# Patient Record
Sex: Male | Born: 1950 | Smoking: Former smoker
Health system: Southern US, Community
[De-identification: ages and names within clinical notes are randomized; demographics above are authoritative.]

## PROBLEM LIST (undated history)

## (undated) DIAGNOSIS — R7303 Prediabetes: Secondary | ICD-10-CM

## (undated) DIAGNOSIS — E785 Hyperlipidemia, unspecified: Secondary | ICD-10-CM

## (undated) DIAGNOSIS — I10 Essential (primary) hypertension: Secondary | ICD-10-CM

## (undated) HISTORY — DX: Prediabetes: R73.03

## (undated) HISTORY — DX: Hyperlipidemia, unspecified: E78.5

## (undated) HISTORY — DX: Essential (primary) hypertension: I10

---

## 2014-04-06 HISTORY — PX: COLONOSCOPY: SHX174

## 2016-01-06 ENCOUNTER — Encounter: Payer: Self-pay | Admitting: Dietician

## 2016-01-06 ENCOUNTER — Encounter: Payer: Medicare Other | Attending: Family Medicine | Admitting: Dietician

## 2016-01-06 DIAGNOSIS — E7800 Pure hypercholesterolemia, unspecified: Secondary | ICD-10-CM

## 2016-01-06 DIAGNOSIS — E669 Obesity, unspecified: Secondary | ICD-10-CM | POA: Insufficient documentation

## 2016-01-06 DIAGNOSIS — R7303 Prediabetes: Secondary | ICD-10-CM

## 2016-01-06 DIAGNOSIS — E785 Hyperlipidemia, unspecified: Secondary | ICD-10-CM | POA: Diagnosis not present

## 2016-01-06 DIAGNOSIS — Z713 Dietary counseling and surveillance: Secondary | ICD-10-CM | POA: Diagnosis not present

## 2016-01-06 DIAGNOSIS — Z6837 Body mass index (BMI) 37.0-37.9, adult: Secondary | ICD-10-CM | POA: Insufficient documentation

## 2016-01-06 NOTE — Patient Instructions (Addendum)
Go to the gym 3 times per week. On the other days walk, or other exercise for 30 minutes or more.   Slow down when you eat and stop when you are satisfied. Drink beverages with zero grams of carbohydrates. Choose low fat milk Choose lean meat, trim meat, take skin from chicken Avoid fried foods Half your plate should be non starchy vegetables

## 2016-01-06 NOTE — Progress Notes (Addendum)
  Medical Nutrition Therapy:  Appt start time: 0830 end time:  0945. Patient arrived late.   Assessment:  Primary concerns today: Patient is here alone.  He would like to lose weight and learn more about prediabetes and how to improve this.  Referral also for hyperlipidemia.  A1C is unknown.    Weight hx: Weight today is 196 lbs.  Highest adult weight. 185 lbs 3-4 months ago.  120 lbs at 65 yo lowest weight 175 UBW about 10 years ago.   Patient lives with his sister.  Both shop and cook.  He [place] from [facility] [place] ago and is retired from [employer].   Preferred Learning Style:   No preference indicated   Learning Readiness:   Ready  MEDICATIONS: see list   DIETARY INTAKE: Eats Usual eating pattern includes 2-3 meals and 2 snacks per day.  Avoided foods include added sugar, icing, added salt.    24-hr recall:  B (8 AM):  Skips or once a week 3 eggs, 6 strips bacon, grits, 2 slices toast with margarine or Peanut Butter AND always Coffee with creamer Snk ( AM): none L ( PM): Ham and cheese Sandwich on [employer] with mayo Snk ( PM): cookies or apple pie or fruit or veges more recently D (8 PM):  corn, macaroni salad, spare ribs Snk ( PM): grapes, banana or "junk food" Beverages: beer (cut down from 64 ounces beer daily to 16-32 ounces 2 times per week), water, diluted sweet tea, juice, hot lemon or vinegar and honey water.  Usual physical activity: treadmill for 30 minutes, swimming for 30 minutes weekly, a few weights  Estimated energy needs: 1400 calories 158 g carbohydrates 105 g protein 39 g fat  Progress Towards Goal(s):  In progress.   Nutritional Diagnosis:  NB-1.1 Food and nutrition-related knowledge deficit As related to balance of carbohydrate, protein, and fat.  As evidenced by patient report and diet hx.    Intervention:  Nutrition counseling and diabetes education initiated. Discussed Carb Counting by food group as method of portion control, reading food  labels, and benefits of increased activity. Also discussed basic physiology of Diabetes and A1c.  Discussed preferred fats, decreased portion size of meat and decreasing fat and refined carbohydrates.  Go to the gym 3 times per week. On the other days walk, or other exercise for 30 minutes or more.   Slow down when you eat and stop when you are satisfied. Drink beverages with zero grams of carbohydrates. Choose low fat milk Choose lean meat, trim meat, take skin from chicken Avoid fried foods Half your plate should be non starchy vegetables  Teaching Method Utilized:  Visual Auditory Hands on  Handouts given during visit include: Living Well with Diabetes Food Label handouts Meal Plan Card  Snack list  Label reaidng  Barriers to learning/adherence to lifestyle change: slow to understand  Demonstrated degree of understanding via:  Teach Back   Monitoring/Evaluation:  Dietary intake, exercise, label reading, and body weight in 1 month(s).

## 2016-02-10 ENCOUNTER — Ambulatory Visit: Payer: Medicare Other | Admitting: Dietician

## 2017-11-08 ENCOUNTER — Other Ambulatory Visit: Payer: Self-pay | Admitting: Family Medicine

## 2017-11-08 DIAGNOSIS — Z87891 Personal history of nicotine dependence: Secondary | ICD-10-CM

## 2017-11-17 ENCOUNTER — Ambulatory Visit: Payer: Medicare Other

## 2017-11-19 ENCOUNTER — Ambulatory Visit
Admission: RE | Admit: 2017-11-19 | Discharge: 2017-11-19 | Disposition: A | Discharge status: Home / Self Care | Payer: Medicare Other | Source: Ambulatory Visit | Attending: Family Medicine | Admitting: Family Medicine

## 2017-11-19 DIAGNOSIS — Z87891 Personal history of nicotine dependence: Secondary | ICD-10-CM

## 2018-01-11 ENCOUNTER — Other Ambulatory Visit (HOSPITAL_COMMUNITY): Payer: Self-pay | Admitting: Gastroenterology

## 2018-01-11 DIAGNOSIS — B1920 Unspecified viral hepatitis C without hepatic coma: Secondary | ICD-10-CM

## 2018-01-25 ENCOUNTER — Ambulatory Visit (HOSPITAL_COMMUNITY)
Admission: RE | Admit: 2018-01-25 | Discharge: 2018-01-25 | Disposition: A | Discharge status: Home / Self Care | Payer: Medicare Other | Source: Ambulatory Visit | Attending: Gastroenterology | Admitting: Gastroenterology

## 2018-01-25 DIAGNOSIS — B1920 Unspecified viral hepatitis C without hepatic coma: Secondary | ICD-10-CM | POA: Diagnosis not present

## 2018-07-12 ENCOUNTER — Other Ambulatory Visit: Payer: Self-pay | Admitting: Gastroenterology

## 2018-07-12 DIAGNOSIS — K7400 Hepatic fibrosis, unspecified: Secondary | ICD-10-CM

## 2019-01-03 IMAGING — US US ABDOMINAL AORTA SCREENING AAA
1 series · 14 of 16 positions shown · non-contrast
Comparison: None.

CLINICAL DATA: Male between 65-75 years of age with a smoking
history.

EXAM:
US ABDOMINAL AORTA MEDICARE SCREENING
TECHNIQUE: Ultrasound examination of the abdominal aorta was performed as a
screening evaluation for abdominal aortic aneurysm.

[Series 1: us abdominal aorta screening aaa · 0.28mm/px · 14 of 16 slices shown]
[im 1/16]
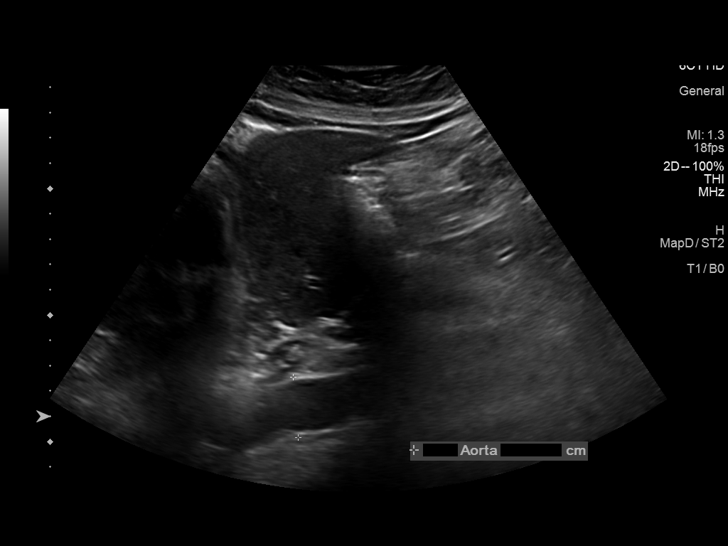
[im 2/16]
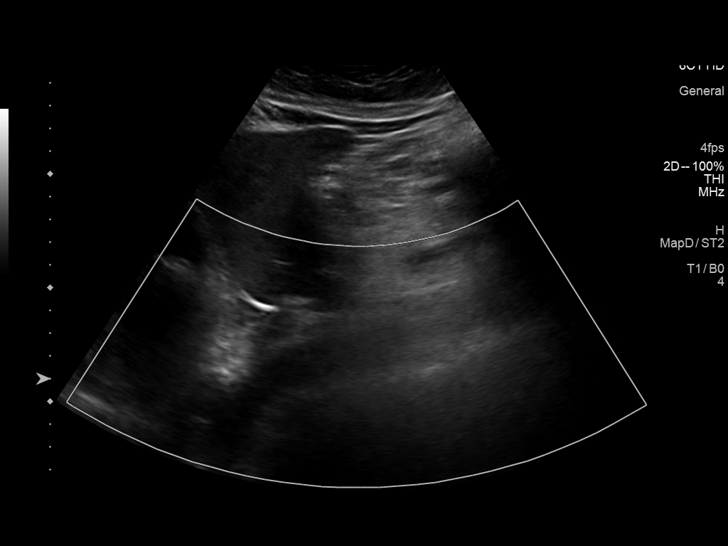
[im 3/16]
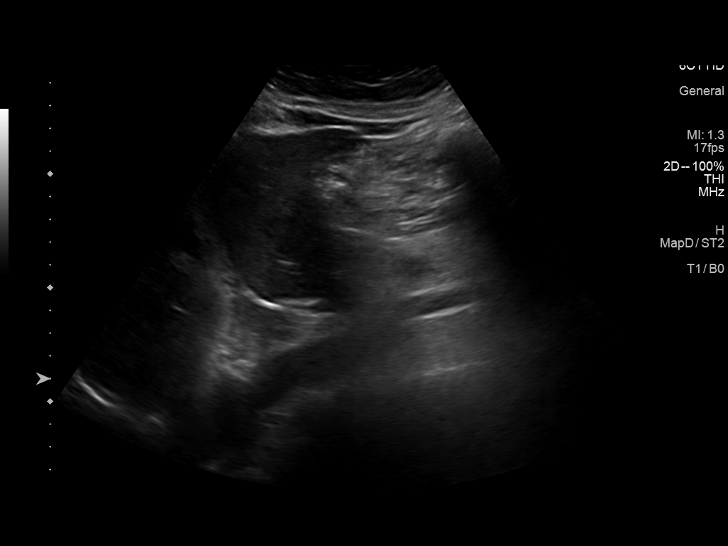
[im 5/16]
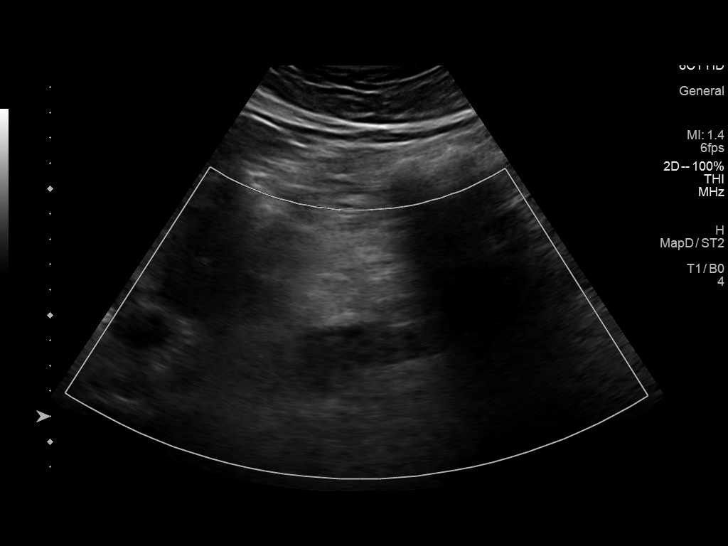
[im 6/16]
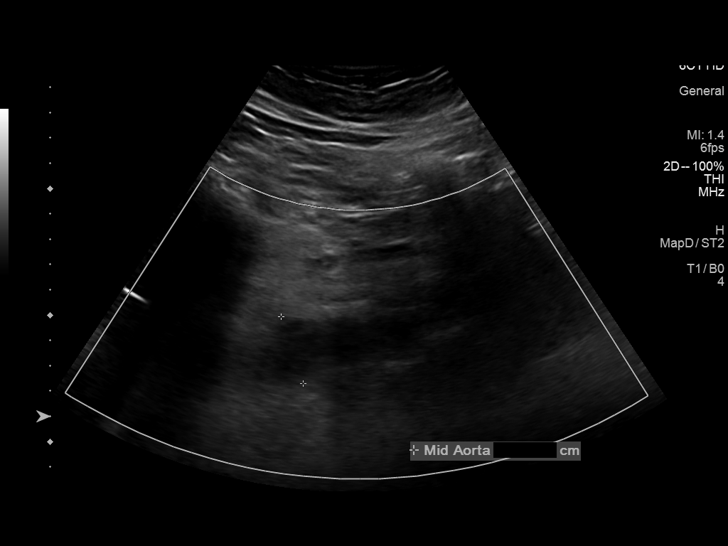
[im 7/16]
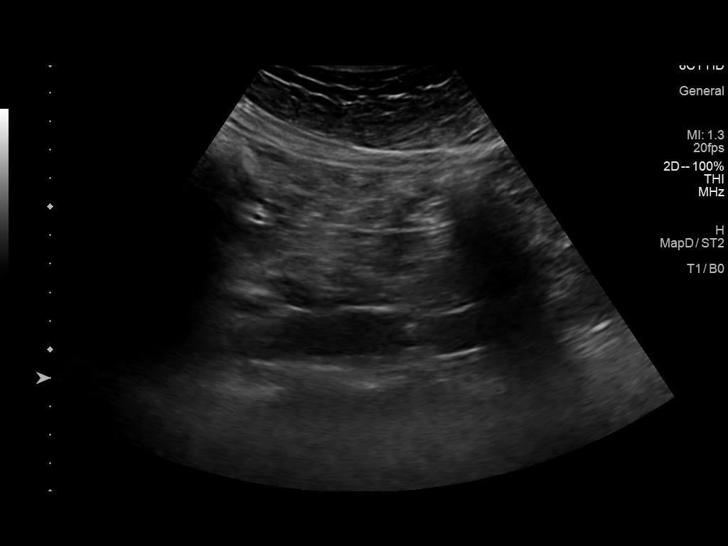
[im 8/16]
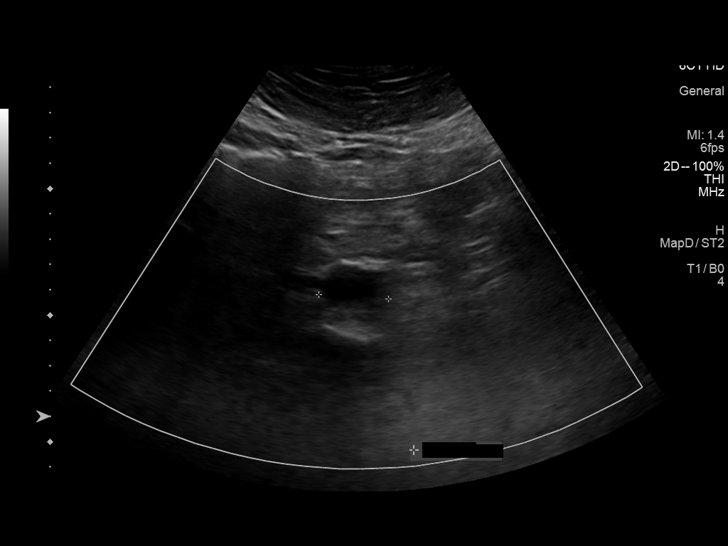
[im 9/16]
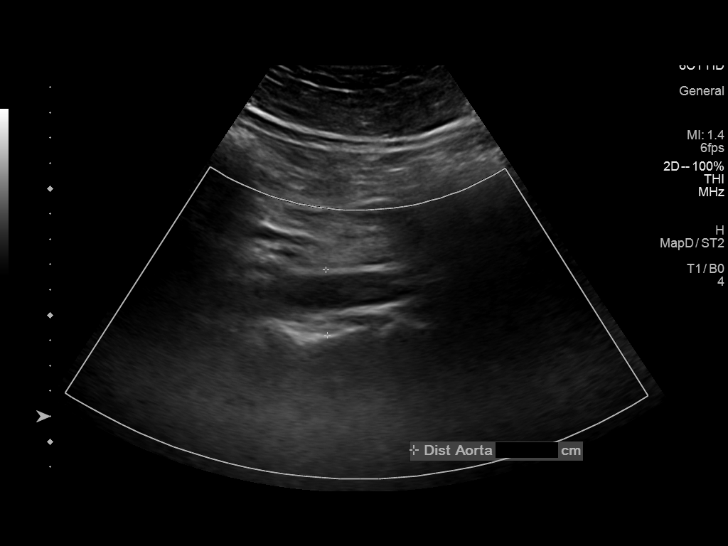
[im 10/16]
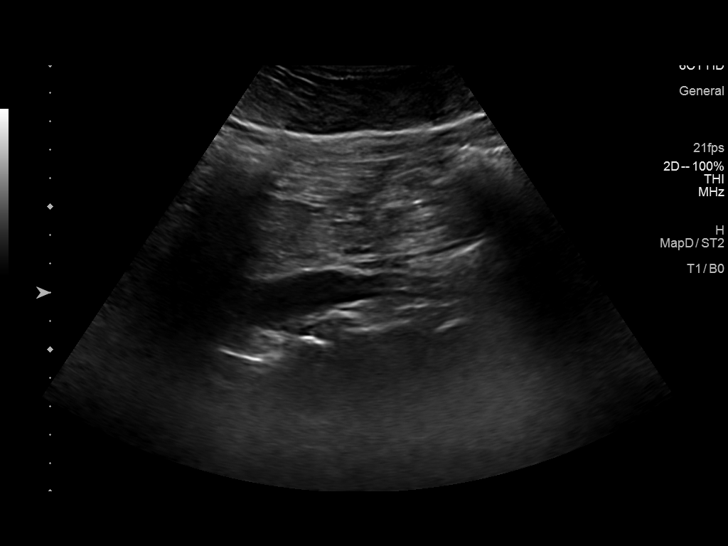
[im 11/16]
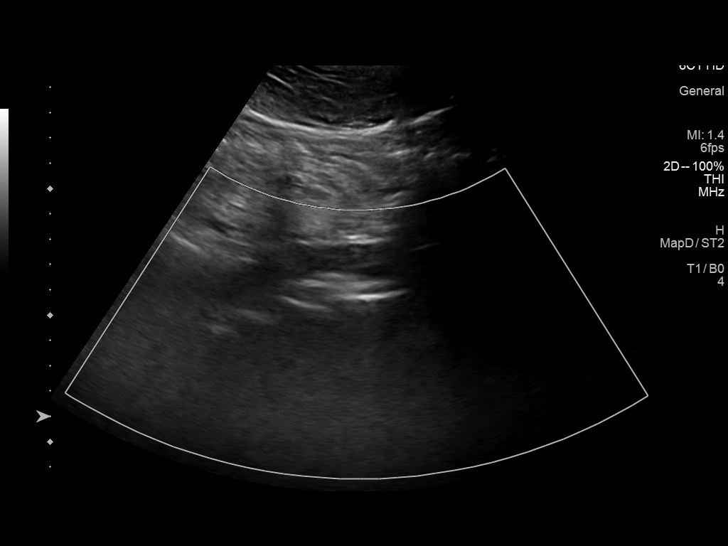
[im 13/16]
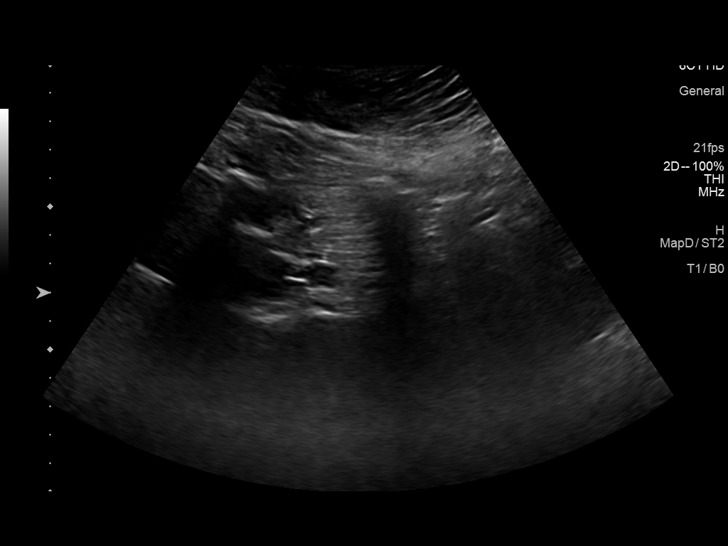
[im 14/16]
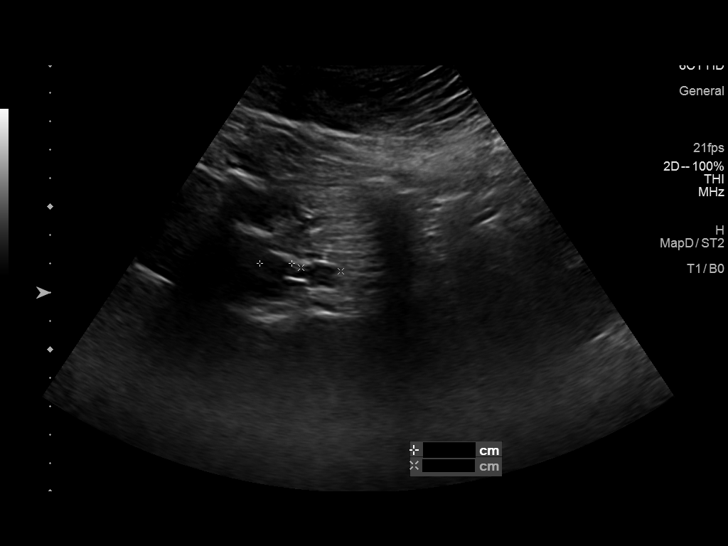
[im 15/16]
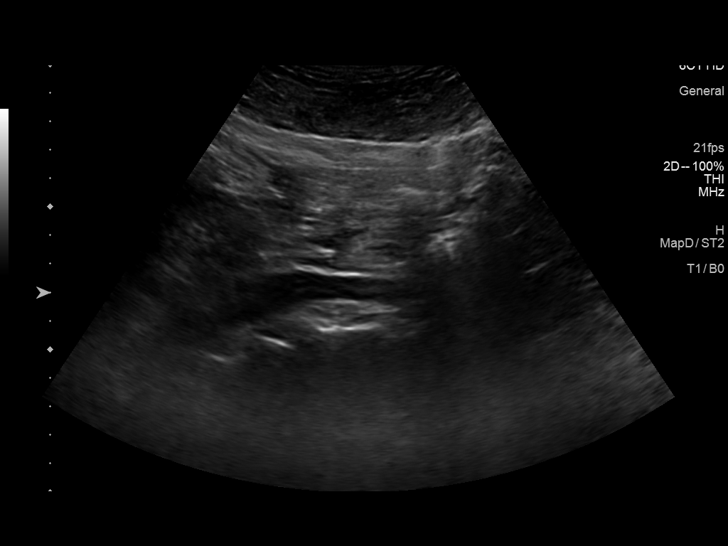
[im 16/16]
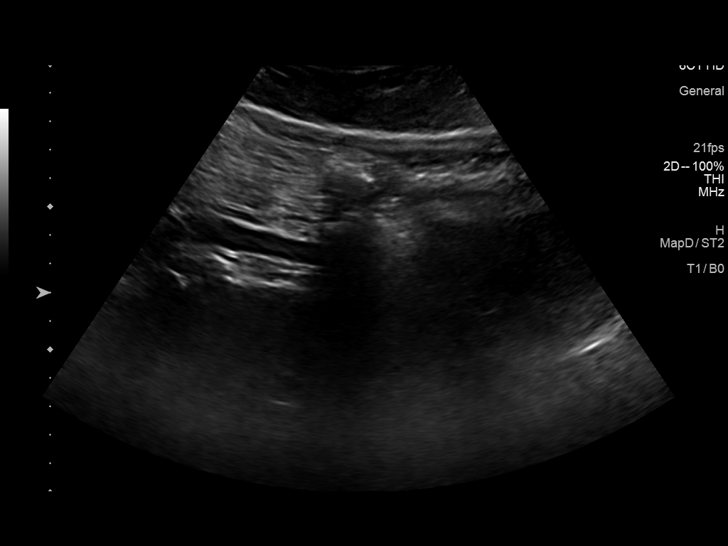

[14 of 16 positions shown; findings below may reference images not displayed]

FINDINGS: Abdominal aortic measurements as follows:

Proximal:  2.5 x 2.4 cm

Mid:  2.8 x 2.8 cm

Distal:  2.6 x 2.0 cm

Mixed echogenic plaque seen within the mid and distal aspects of the
abdominal aorta.
IMPRESSION: 1. Mild ectasia of the abdominal aorta measuring 2.8 cm in maximal
diameter. Recommend follow-up aortic ultrasound in 5 years. This
recommendation follows ACR consensus guidelines: White Paper of the
ACR Incidental Findings Committee II on Vascular Findings. [HOSPITAL] 6173; [DATE].
2.  Aortic Atherosclerosis ([id number]-HUX.X).

## 2019-08-16 ENCOUNTER — Ambulatory Visit: Payer: Medicare Other | Attending: Internal Medicine

## 2019-10-28 IMAGING — US US ABDOMEN LIMITED W/ ELASTOGRAPHY
1 series · 13 of 25 positions shown · non-contrast
Comparison: None.

CLINICAL DATA: Chronic hepatitis-C without hepatic coma.

EXAM:
US ABDOMEN LIMITED - RIGHT UPPER QUADRANT
ULTRASOUND HEPATIC ELASTOGRAPHY
TECHNIQUE: Limited right upper quadrant abdominal ultrasound was performed. In
addition, ultrasound elastography evaluation of the liver was
performed. A region of interest was placed in the right lobe of the
liver. Following application of a compressive sonographic pulse,
shear waves were detected in the adjacent hepatic tissue and the
shear wave velocity was calculated. Multiple assessments were
performed at the selected site. Median shear wave velocity is
correlated to a Metavir fibrosis score.

[Series 1: us abdomen limited w/ elastography · 0.24mm/px · 13 of 65 slices shown]
[im 1/65]
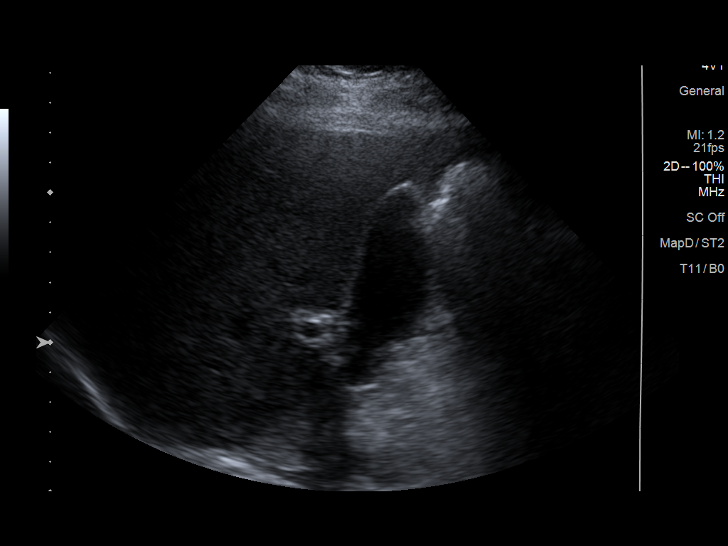
[im 6/65]
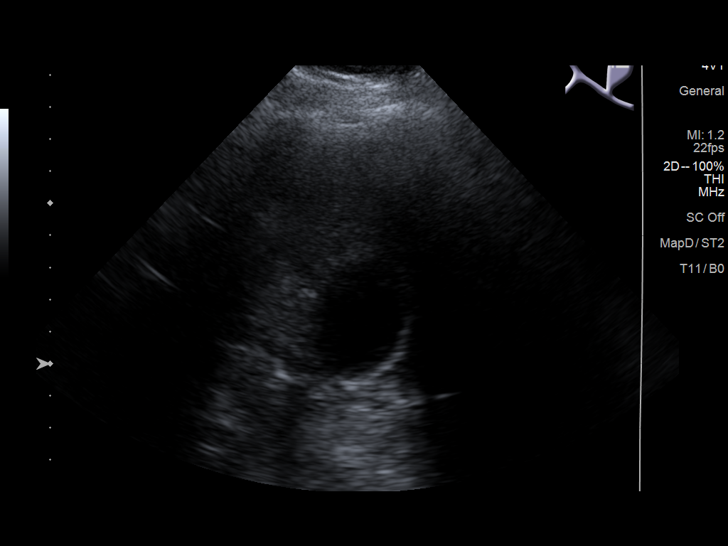
[im 11/65]
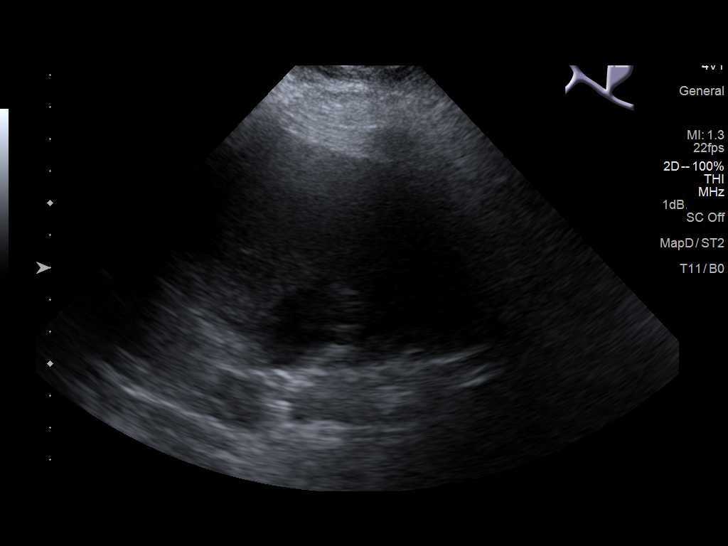
[im 17/65]
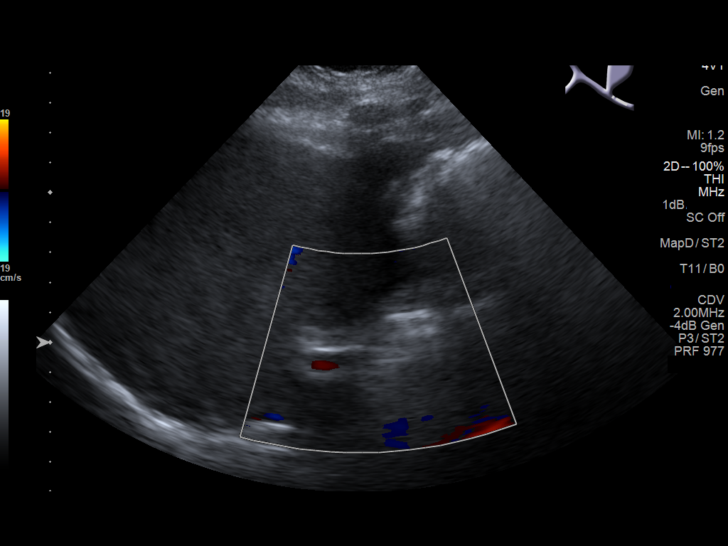
[im 22/65]
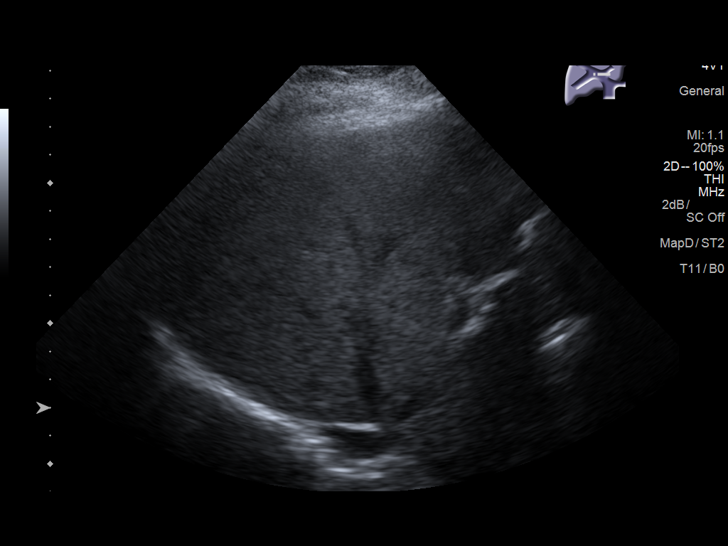
[im 27/65]
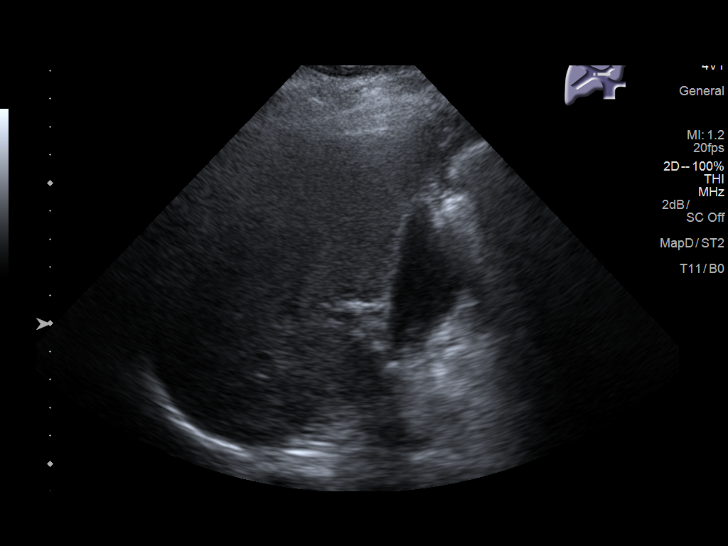
[im 33/65]
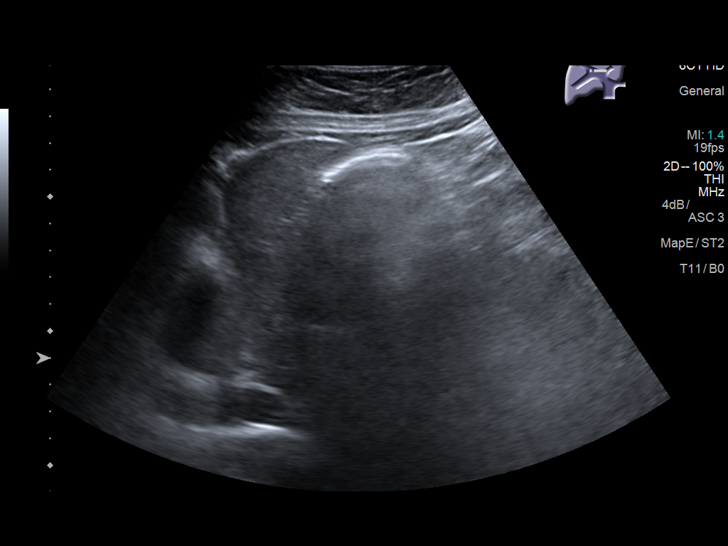
[im 38/65]
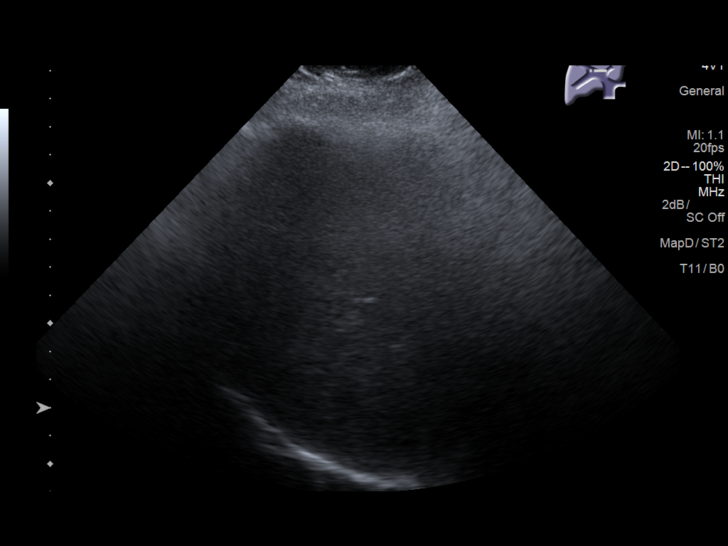
[im 43/65]
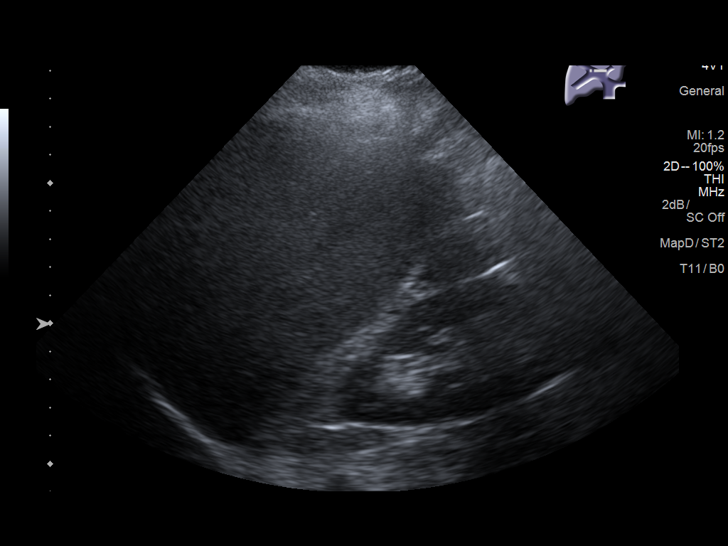
[im 49/65]
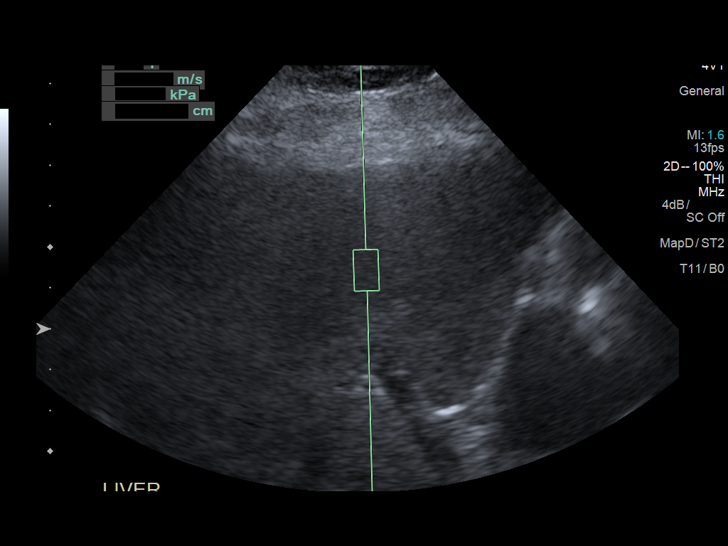
[im 54/65]
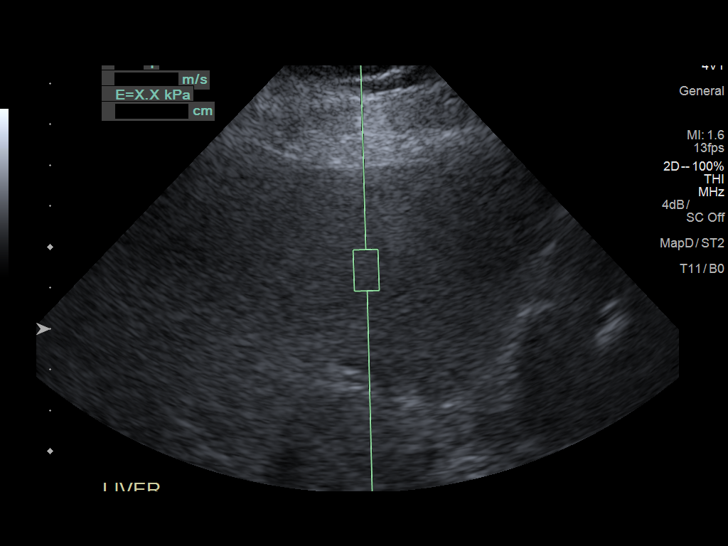
[im 59/65]
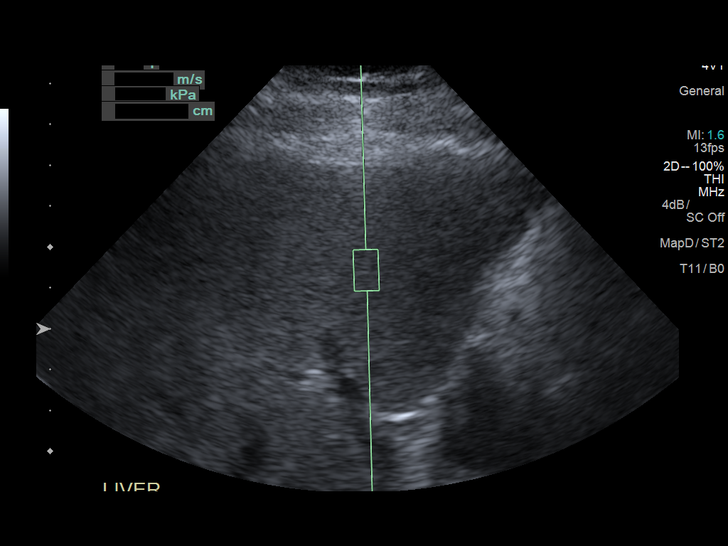
[im 65/65]
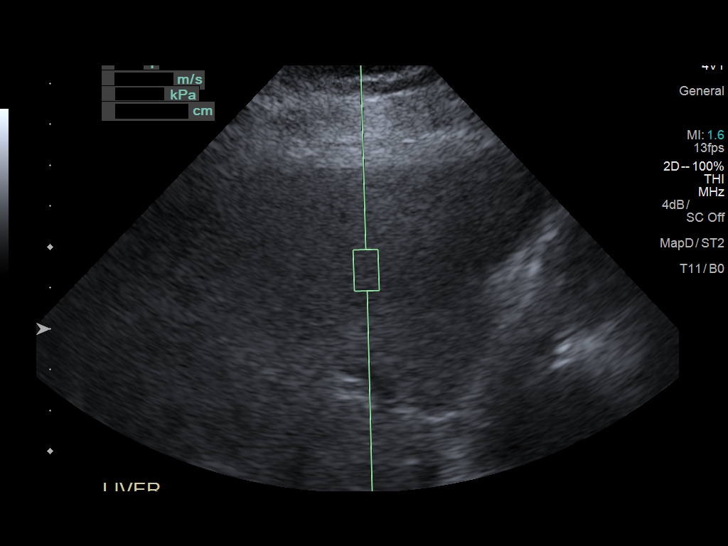

[13 of 25 positions shown; findings below may reference images not displayed]

FINDINGS: ULTRASOUND ABDOMEN LIMITED RIGHT UPPER QUADRANT

Gallbladder:

No gallstones or wall thickening visualized. No sonographic Murphy
sign noted.

Common bile duct:

Diameter: 4 mm, within normal limits.

Liver:

No focal lesion identified. Within normal limits in parenchymal
echogenicity. Portal vein is patent on color Doppler imaging with
normal direction of blood flow towards the liver.

ULTRASOUND HEPATIC ELASTOGRAPHY

Device: Siemens Helix VTQ

Patient position: Left Lateral Decubitus

Transducer 4V1

Number of measurements: 9

Hepatic segment:  8

Median velocity:   4.12 m/sec

IQR:

IQR/Median velocity ratio:

Corresponding Metavir fibrosis score:  Some F3 + F4

Risk of fibrosis: High

Limitations of exam: Patient unable to perform breath holding

Please note that abnormal shear wave velocities may also be
identified in clinical settings other than with hepatic fibrosis,
such as: acute hepatitis, elevated right heart and central venous
pressures including use of beta blockers, Anjo Gabriel disease
([place]), infiltrative processes such as
mastocytosis/amyloidosis/infiltrative tumor, extrahepatic
cholestasis, in the post-prandial state, and liver transplantation.
Correlation with patient history, laboratory data, and clinical
condition recommended.
IMPRESSION: ULTRASOUND ABDOMEN:

Negative.  No hepatobiliary abnormality identified.

ULTRASOUND HEPATIC ELASTOGRAHY:

Median hepatic shear wave velocity is calculated at 4.12 m/sec.

Corresponding Metavir fibrosis score is Some F3 + F4.

Risk of fibrosis is High.

Follow-up: Follow up advised

## 2020-05-14 DIAGNOSIS — J4520 Mild intermittent asthma, uncomplicated: Secondary | ICD-10-CM | POA: Diagnosis not present

## 2020-05-14 DIAGNOSIS — E7800 Pure hypercholesterolemia, unspecified: Secondary | ICD-10-CM | POA: Diagnosis not present

## 2020-05-14 DIAGNOSIS — K219 Gastro-esophageal reflux disease without esophagitis: Secondary | ICD-10-CM | POA: Diagnosis not present

## 2020-05-14 DIAGNOSIS — I10 Essential (primary) hypertension: Secondary | ICD-10-CM | POA: Diagnosis not present

## 2020-05-14 DIAGNOSIS — H269 Unspecified cataract: Secondary | ICD-10-CM | POA: Diagnosis not present

## 2020-05-14 DIAGNOSIS — J45909 Unspecified asthma, uncomplicated: Secondary | ICD-10-CM | POA: Diagnosis not present

## 2020-05-30 DIAGNOSIS — R0981 Nasal congestion: Secondary | ICD-10-CM | POA: Diagnosis not present

## 2020-06-24 DIAGNOSIS — J310 Chronic rhinitis: Secondary | ICD-10-CM | POA: Diagnosis not present

## 2020-06-24 DIAGNOSIS — J342 Deviated nasal septum: Secondary | ICD-10-CM | POA: Diagnosis not present

## 2020-06-24 DIAGNOSIS — J343 Hypertrophy of nasal turbinates: Secondary | ICD-10-CM | POA: Diagnosis not present

## 2020-06-28 ENCOUNTER — Other Ambulatory Visit: Payer: Self-pay | Admitting: Otolaryngology

## 2020-07-01 ENCOUNTER — Other Ambulatory Visit: Payer: Self-pay

## 2020-07-01 ENCOUNTER — Encounter (HOSPITAL_BASED_OUTPATIENT_CLINIC_OR_DEPARTMENT_OTHER): Payer: Self-pay | Admitting: Otolaryngology

## 2020-07-03 ENCOUNTER — Encounter (HOSPITAL_BASED_OUTPATIENT_CLINIC_OR_DEPARTMENT_OTHER)
Admission: RE | Admit: 2020-07-03 | Discharge: 2020-07-03 | Disposition: A | Discharge status: Home / Self Care | Payer: Medicare Other | Source: Ambulatory Visit | Attending: Otolaryngology | Admitting: Otolaryngology

## 2020-07-03 DIAGNOSIS — Z01812 Encounter for preprocedural laboratory examination: Secondary | ICD-10-CM | POA: Insufficient documentation

## 2020-07-03 DIAGNOSIS — Z20822 Contact with and (suspected) exposure to covid-19: Secondary | ICD-10-CM | POA: Diagnosis not present

## 2020-07-03 LAB — BASIC METABOLIC PANEL
Anion gap: 5 (ref 5–15)
BUN: 10 mg/dL (ref 8–23)
CO2: 23 mmol/L (ref 22–32)
Calcium: 9.3 mg/dL (ref 8.9–10.3)
Chloride: 111 mmol/L (ref 98–111)
Creatinine, Ser: 1.04 mg/dL (ref 0.61–1.24)
GFR, Estimated: >60 mL/min (ref >60)
Glucose, Bld: 103 mg/dL — ABNORMAL HIGH (ref 70–99)
Potassium: 4.7 mmol/L (ref 3.5–5.1)
Sodium: 139 mmol/L (ref 135–145)

## 2020-07-05 ENCOUNTER — Other Ambulatory Visit (HOSPITAL_COMMUNITY)
Admission: RE | Admit: 2020-07-05 | Discharge: 2020-07-05 | Disposition: A | Discharge status: Home / Self Care | Payer: Medicare Other | Source: Ambulatory Visit | Attending: Otolaryngology | Admitting: Otolaryngology

## 2020-07-05 DIAGNOSIS — Z20822 Contact with and (suspected) exposure to covid-19: Secondary | ICD-10-CM | POA: Diagnosis not present

## 2020-07-05 DIAGNOSIS — Z01812 Encounter for preprocedural laboratory examination: Secondary | ICD-10-CM | POA: Diagnosis not present

## 2020-07-05 LAB — SARS CORONAVIRUS 2 (TAT 6-24 HRS): SARS Coronavirus 2: NEGATIVE

## 2020-07-08 DIAGNOSIS — I10 Essential (primary) hypertension: Secondary | ICD-10-CM | POA: Diagnosis not present

## 2020-07-08 DIAGNOSIS — J4520 Mild intermittent asthma, uncomplicated: Secondary | ICD-10-CM | POA: Diagnosis not present

## 2020-07-08 DIAGNOSIS — K219 Gastro-esophageal reflux disease without esophagitis: Secondary | ICD-10-CM | POA: Diagnosis not present

## 2020-07-08 DIAGNOSIS — E7800 Pure hypercholesterolemia, unspecified: Secondary | ICD-10-CM | POA: Diagnosis not present

## 2020-07-08 DIAGNOSIS — J45909 Unspecified asthma, uncomplicated: Secondary | ICD-10-CM | POA: Diagnosis not present

## 2020-07-08 DIAGNOSIS — H269 Unspecified cataract: Secondary | ICD-10-CM | POA: Diagnosis not present

## 2020-07-09 ENCOUNTER — Encounter (HOSPITAL_BASED_OUTPATIENT_CLINIC_OR_DEPARTMENT_OTHER)
Admission: RE | Disposition: A | Discharge status: Home / Self Care | Payer: Self-pay | Source: Home / Self Care | Attending: Otolaryngology

## 2020-07-09 ENCOUNTER — Ambulatory Visit (HOSPITAL_BASED_OUTPATIENT_CLINIC_OR_DEPARTMENT_OTHER): Payer: Medicare Other | Admitting: Anesthesiology

## 2020-07-09 ENCOUNTER — Other Ambulatory Visit: Payer: Self-pay

## 2020-07-09 ENCOUNTER — Ambulatory Visit (HOSPITAL_BASED_OUTPATIENT_CLINIC_OR_DEPARTMENT_OTHER)
Admission: RE | Admit: 2020-07-09 | Discharge: 2020-07-09 | Disposition: A | Discharge status: Home / Self Care | Payer: Medicare Other | Attending: Otolaryngology | Admitting: Otolaryngology

## 2020-07-09 ENCOUNTER — Encounter (HOSPITAL_BASED_OUTPATIENT_CLINIC_OR_DEPARTMENT_OTHER): Payer: Self-pay | Admitting: Otolaryngology

## 2020-07-09 DIAGNOSIS — R7303 Prediabetes: Secondary | ICD-10-CM | POA: Insufficient documentation

## 2020-07-09 DIAGNOSIS — J342 Deviated nasal septum: Secondary | ICD-10-CM | POA: Diagnosis not present

## 2020-07-09 DIAGNOSIS — Z87891 Personal history of nicotine dependence: Secondary | ICD-10-CM | POA: Insufficient documentation

## 2020-07-09 DIAGNOSIS — J343 Hypertrophy of nasal turbinates: Secondary | ICD-10-CM | POA: Diagnosis not present

## 2020-07-09 DIAGNOSIS — J3489 Other specified disorders of nose and nasal sinuses: Secondary | ICD-10-CM | POA: Diagnosis not present

## 2020-07-09 DIAGNOSIS — I10 Essential (primary) hypertension: Secondary | ICD-10-CM | POA: Insufficient documentation

## 2020-07-09 DIAGNOSIS — K219 Gastro-esophageal reflux disease without esophagitis: Secondary | ICD-10-CM | POA: Insufficient documentation

## 2020-07-09 HISTORY — PX: NASAL SEPTOPLASTY W/ TURBINOPLASTY: SHX2070

## 2020-07-09 LAB — GLUCOSE, CAPILLARY: Glucose-Capillary: 165 mg/dL — ABNORMAL HIGH (ref 70–99)

## 2020-07-09 SURGERY — SEPTOPLASTY, NOSE, WITH NASAL TURBINATE REDUCTION
Anesthesia: General | Site: Nose | Laterality: Bilateral

## 2020-07-09 MED ORDER — ROCURONIUM BROMIDE 10 MG/ML (PF) SYRINGE
PREFILLED_SYRINGE | INTRAVENOUS | Status: AC
  Filled 2020-07-09: qty 10

## 2020-07-09 MED ORDER — AMOXICILLIN 875 MG PO TABS: 875.0000 mg | ORAL_TABLET | Freq: Two times a day (BID) | ORAL | 0 refills | Status: AC

## 2020-07-09 MED ORDER — FENTANYL CITRATE (PF) 100 MCG/2ML IJ SOLN: 25.0000 ug | INTRAMUSCULAR | Status: DC | PRN

## 2020-07-09 MED ORDER — FENTANYL CITRATE (PF) 100 MCG/2ML IJ SOLN
INTRAMUSCULAR | Status: DC | PRN
  Administered 2020-07-09: 100 ug via INTRAVENOUS

## 2020-07-09 MED ORDER — PHENYLEPHRINE 40 MCG/ML (10ML) SYRINGE FOR IV PUSH (FOR BLOOD PRESSURE SUPPORT)
PREFILLED_SYRINGE | INTRAVENOUS | Status: AC
  Filled 2020-07-09: qty 10

## 2020-07-09 MED ORDER — CEFAZOLIN SODIUM 1 G IJ SOLR
INTRAMUSCULAR | Status: AC
  Filled 2020-07-09: qty 20

## 2020-07-09 MED ORDER — LIDOCAINE 2% (20 MG/ML) 5 ML SYRINGE
INTRAMUSCULAR | Status: AC
  Filled 2020-07-09: qty 5

## 2020-07-09 MED ORDER — MUPIROCIN 2 % EX OINT
TOPICAL_OINTMENT | CUTANEOUS | Status: DC | PRN
  Administered 2020-07-09: 1 via NASAL

## 2020-07-09 MED ORDER — MUPIROCIN 2 % EX OINT
TOPICAL_OINTMENT | CUTANEOUS | Status: AC
  Filled 2020-07-09: qty 22

## 2020-07-09 MED ORDER — EPHEDRINE 5 MG/ML INJ
INTRAVENOUS | Status: AC
  Filled 2020-07-09: qty 10

## 2020-07-09 MED ORDER — SUGAMMADEX SODIUM 200 MG/2ML IV SOLN
INTRAVENOUS | Status: DC | PRN
  Administered 2020-07-09: 170 mg via INTRAVENOUS

## 2020-07-09 MED ORDER — PROPOFOL 500 MG/50ML IV EMUL
INTRAVENOUS | Status: AC
  Filled 2020-07-09: qty 50

## 2020-07-09 MED ORDER — LACTATED RINGERS IV SOLN: INTRAVENOUS | Status: DC

## 2020-07-09 MED ORDER — DEXAMETHASONE SODIUM PHOSPHATE 4 MG/ML IJ SOLN
INTRAMUSCULAR | Status: DC | PRN
  Administered 2020-07-09: 10 mg via INTRAVENOUS

## 2020-07-09 MED ORDER — LIDOCAINE-EPINEPHRINE 1 %-1:100000 IJ SOLN
INTRAMUSCULAR | Status: AC
  Filled 2020-07-09: qty 1

## 2020-07-09 MED ORDER — OXYCODONE HCL 5 MG PO TABS
5.0000 mg | ORAL_TABLET | Freq: Once | ORAL | Status: DC | PRN
Start: 2020-07-09 — End: 2020-07-09

## 2020-07-09 MED ORDER — OXYCODONE HCL 5 MG/5ML PO SOLN
5.0000 mg | Freq: Once | ORAL | Status: DC | PRN
Start: 2020-07-09 — End: 2020-07-09

## 2020-07-09 MED ORDER — CEFAZOLIN SODIUM-DEXTROSE 2-3 GM-%(50ML) IV SOLR
INTRAVENOUS | Status: DC | PRN
  Administered 2020-07-09: 2 g via INTRAVENOUS

## 2020-07-09 MED ORDER — LABETALOL HCL 5 MG/ML IV SOLN
INTRAVENOUS | Status: DC | PRN
  Administered 2020-07-09 (×2): 10 mg via INTRAVENOUS

## 2020-07-09 MED ORDER — SUCCINYLCHOLINE CHLORIDE 200 MG/10ML IV SOSY
PREFILLED_SYRINGE | INTRAVENOUS | Status: AC
  Filled 2020-07-09: qty 10

## 2020-07-09 MED ORDER — ROCURONIUM BROMIDE 100 MG/10ML IV SOLN
INTRAVENOUS | Status: DC | PRN
  Administered 2020-07-09: 60 mg via INTRAVENOUS

## 2020-07-09 MED ORDER — DEXAMETHASONE SODIUM PHOSPHATE 10 MG/ML IJ SOLN
INTRAMUSCULAR | Status: AC
  Filled 2020-07-09: qty 1

## 2020-07-09 MED ORDER — FENTANYL CITRATE (PF) 100 MCG/2ML IJ SOLN
INTRAMUSCULAR | Status: AC
  Filled 2020-07-09: qty 2

## 2020-07-09 MED ORDER — OXYMETAZOLINE HCL 0.05 % NA SOLN
NASAL | Status: DC | PRN
  Administered 2020-07-09: 1 via TOPICAL

## 2020-07-09 MED ORDER — SUGAMMADEX SODIUM 500 MG/5ML IV SOLN
INTRAVENOUS | Status: AC
  Filled 2020-07-09: qty 5

## 2020-07-09 MED ORDER — OXYMETAZOLINE HCL 0.05 % NA SOLN
NASAL | Status: AC
  Filled 2020-07-09: qty 30

## 2020-07-09 MED ORDER — PROPOFOL 10 MG/ML IV BOLUS
INTRAVENOUS | Status: DC | PRN
  Administered 2020-07-09: 140 mg via INTRAVENOUS

## 2020-07-09 MED ORDER — LABETALOL HCL 5 MG/ML IV SOLN
INTRAVENOUS | Status: AC
  Filled 2020-07-09: qty 4

## 2020-07-09 MED ORDER — ONDANSETRON HCL 4 MG/2ML IJ SOLN
INTRAMUSCULAR | Status: AC
  Filled 2020-07-09: qty 2

## 2020-07-09 MED ORDER — ONDANSETRON HCL 4 MG/2ML IJ SOLN: 4.0000 mg | Freq: Once | INTRAMUSCULAR | Status: DC | PRN

## 2020-07-09 MED ORDER — LIDOCAINE-EPINEPHRINE 1 %-1:100000 IJ SOLN
INTRAMUSCULAR | Status: DC | PRN
  Administered 2020-07-09: 3 mL

## 2020-07-09 MED ORDER — OXYCODONE-ACETAMINOPHEN 5-325 MG PO TABS: 1.0000 | ORAL_TABLET | ORAL | 0 refills | Status: AC | PRN

## 2020-07-09 MED ORDER — LIDOCAINE HCL (CARDIAC) PF 100 MG/5ML IV SOSY
PREFILLED_SYRINGE | INTRAVENOUS | Status: DC | PRN
  Administered 2020-07-09: 80 mg via INTRAVENOUS

## 2020-07-09 SURGICAL SUPPLY — 32 items
ATTRACTOMAT 16X20 MAGNETIC DRP (DRAPES) IMPLANT
CANISTER SUCT 1200ML W/VALVE (MISCELLANEOUS) ×2 IMPLANT
COAGULATOR SUCT 8FR VV (MISCELLANEOUS) ×2 IMPLANT
COVER WAND RF STERILE (DRAPES) IMPLANT
DECANTER SPIKE VIAL GLASS SM (MISCELLANEOUS) ×2 IMPLANT
DRSG NASOPORE 8CM (GAUZE/BANDAGES/DRESSINGS) ×2 IMPLANT
DRSG TELFA 3X8 NADH (GAUZE/BANDAGES/DRESSINGS) IMPLANT
ELECT REM PT RETURN 9FT ADLT (ELECTROSURGICAL) ×2
ELECTRODE REM PT RTRN 9FT ADLT (ELECTROSURGICAL) ×1 IMPLANT
GLOVE SURG ENC MOIS LTX SZ7.5 (GLOVE) ×2 IMPLANT
GLOVE SURG POLYISO LF SZ7 (GLOVE) ×2 IMPLANT
GLOVE SURG UNDER POLY LF SZ7 (GLOVE) ×2 IMPLANT
GOWN STRL REUS W/ TWL LRG LVL3 (GOWN DISPOSABLE) ×2 IMPLANT
GOWN STRL REUS W/TWL LRG LVL3 (GOWN DISPOSABLE) ×4
NEEDLE HYPO 25X1 1.5 SAFETY (NEEDLE) ×2 IMPLANT
NS IRRIG 1000ML POUR BTL (IV SOLUTION) ×2 IMPLANT
PACK BASIN DAY SURGERY FS (CUSTOM PROCEDURE TRAY) ×2 IMPLANT
PACK ENT DAY SURGERY (CUSTOM PROCEDURE TRAY) ×2 IMPLANT
SLEEVE SCD COMPRESS KNEE MED (STOCKING) ×2 IMPLANT
SOLUTION BUTLER CLEAR DIP (MISCELLANEOUS) ×2 IMPLANT
SPLINT NASAL AIRWAY SILICONE (MISCELLANEOUS) ×2 IMPLANT
SPONGE GAUZE 2X2 8PLY STRL LF (GAUZE/BANDAGES/DRESSINGS) ×2 IMPLANT
SPONGE NEURO XRAY DETECT 1X3 (DISPOSABLE) ×2 IMPLANT
SUT CHROMIC 4 0 P 3 18 (SUTURE) ×2 IMPLANT
SUT PLAIN 4 0 ~~LOC~~ 1 (SUTURE) ×2 IMPLANT
SUT PROLENE 3 0 PS 2 (SUTURE) ×2 IMPLANT
SUT VIC AB 4-0 P-3 18XBRD (SUTURE) IMPLANT
SUT VIC AB 4-0 P3 18 (SUTURE)
TOWEL GREEN STERILE FF (TOWEL DISPOSABLE) ×2 IMPLANT
TUBE SALEM SUMP 12R W/ARV (TUBING) IMPLANT
TUBE SALEM SUMP 16 FR W/ARV (TUBING) ×2 IMPLANT
YANKAUER SUCT BULB TIP NO VENT (SUCTIONS) ×2 IMPLANT

## 2020-07-09 NOTE — H&P (Signed)
Cc: Chronic nasal obstruction  HPI: The patient is a 70 y/o male who presents today for evaluation of chronic nasal obstruction.  The patient has noted difficulty breathing through his nose for several years. His symptoms are worse at night. He notes some facial pressure but no pain or purulent drainage. The patient denies nasal trauma. He has tried steroid nasal sprays and other OTC sinus medications with no relief. Previous ENT surgery is denied.   The patient's review of systems (constitutional, eyes, ENT, cardiovascular, respiratory, GI, musculoskeletal, skin, neurologic, psychiatric, endocrine, hematologic, allergic) is noted in the ROS questionnaire.  It is reviewed with the patient.    Family health history: No HTN, DM, CAD, hearing loss or bleeding disorder.  Major events: None.  Ongoing medical problems: Hypertension, reflux, chronic bronchitis.  Social history: The patient is married. He is a former smoker. He denies the use of alcohol or illegal drugs.   Exam: General: Communicates without difficulty, well nourished, no acute distress. Head: Normocephalic, no evidence injury, no tenderness, facial buttresses intact without stepoff. Eyes: PERRL, EOMI. No scleral icterus, conjunctivae clear. Neuro: CN II exam reveals vision grossly intact.  No nystagmus at any point of gaze. Ears: Auricles well formed without lesions.  Ear canals are intact without mass or lesion.  No erythema or edema is appreciated.  The TMs are intact without fluid. Nose: External evaluation reveals normal support and skin without lesions.  Dorsum is intact.  Anterior rhinoscopy reveals severely congested and edematous mucosa over anterior aspect of the inferior turbinates and nasal septum.  No purulence is noted. Middle meatus is not well visualized. Oral:  Oral cavity and oropharynx are intact, symmetric, without erythema or edema.  Mucosa is moist without lesions. Neck: Full range of motion without pain.  There is no  significant lymphadenopathy.  No masses palpable.  Thyroid bed within normal limits to palpation.  Parotid glands and submandibular glands equal bilaterally without mass.  Trachea is midline. Neuro:  CN 2-12 grossly intact. Gait normal. Vestibular: No nystagmus at any point of gaze.   Procedure: Flexible Nasal Endoscopy Description: Risks, benefits, and alternatives of flexible endoscopy were explained to the patient. Specific mention was made of the risk of throat numbness with difficulty swallowing, possible bleeding from the nose and mouth, and pain from the procedure. The patient gave oral consent to proceed.  The flexible scope was inserted into the right nasal cavity. Endoscopy of the interior nasal cavity, superior, inferior, and middle meatus was performed. The sphenoid-ethmoid recess was examined. Edematous mucosa was noted. No polyp, mass, or lesion was appreciated. Olfactory cleft was clear. Nasopharynx was clear. Turbinates were hypertrophied but without mass. Incomplete response to decongestion. The procedure was repeated on the contralateral side with NSD. The patient tolerated the procedure well.   Assessment  1. Severe nasal mucosal congestion with bilateral inferior turbinate hypertrophy and left septal deviation. This results in >95% nasal obstruction.  2. No purulent drainage, polyps, or other suspicious mass or lesion is noted on today's nasal endoscopy.  Plan  1. The physical exam and nasal endoscopy findings reviewed with the patient.  2. Treatment options include conservative management with daily steroid nasal spray versus septoplasty and bilateral inferior turbinate reduction. The risks, benefits, alternatives, and details of the procedures are reviewed with the patient. Questions are invited and answered. 3. The patient is interested in proceeding with surgical intervention.  We will schedule the procedures in accordance with the patient's schedule.

## 2020-07-09 NOTE — Anesthesia Procedure Notes (Signed)
Procedure Name: Intubation Performed by: Ezequiel Kayser, CRNA Pre-anesthesia Checklist: Patient identified, Emergency Drugs available, Suction available and Patient being monitored Patient Re-evaluated:Patient Re-evaluated prior to induction Oxygen Delivery Method: Circle system utilized Preoxygenation: Pre-oxygenation with 100% oxygen Induction Type: IV induction Ventilation: Mask ventilation without difficulty Laryngoscope Size: Mac and 3 Grade View: Grade I Tube type: Oral Tube size: 7.5 mm Number of attempts: 1 Airway Equipment and Method: Stylet and Oral airway Placement Confirmation: ETT inserted through vocal cords under direct vision,  positive ETCO2 and breath sounds checked- equal and bilateral Secured at: 22 cm Tube secured with: Tape Dental Injury: Teeth and Oropharynx as per pre-operative assessment  Comments: Eyes taped prior to mask ventilation

## 2020-07-09 NOTE — Transfer of Care (Signed)
Immediate Anesthesia Transfer of Care Note  Patient: Shane Cabrera  Procedure(s) Performed: NASAL SEPTOPLASTY WITH BILATERAL  TURBINATE REDUCTION (Bilateral Nose)  Patient Location: PACU  Anesthesia Type:General  Level of Consciousness: awake, alert , oriented, drowsy and patient cooperative  Airway & Oxygen Therapy: Patient Spontanous Breathing and Patient connected to face mask oxygen  Post-op Assessment: Report given to RN and Post -op Vital signs reviewed and stable  Post vital signs: Reviewed and stable  Last Vitals:  Vitals Value Taken Time  BP    Temp    Pulse    Resp    SpO2      Last Pain:  Vitals:   07/09/20 0757  TempSrc: Oral  PainSc: 0-No pain         Complications: No complications documented.

## 2020-07-09 NOTE — Anesthesia Postprocedure Evaluation (Signed)
Anesthesia Post Note  Patient: Rahman Ferrall  Procedure(s) Performed: NASAL SEPTOPLASTY WITH BILATERAL  TURBINATE REDUCTION (Bilateral Nose)     Patient location during evaluation: PACU Anesthesia Type: General Level of consciousness: awake and alert Pain management: pain level controlled Vital Signs Assessment: post-procedure vital signs reviewed and stable Respiratory status: spontaneous breathing, nonlabored ventilation and respiratory function stable Cardiovascular status: blood pressure returned to baseline and stable Postop Assessment: no apparent nausea or vomiting Anesthetic complications: no   No complications documented.  Last Vitals:  Vitals:   07/09/20 1030 07/09/20 1051  BP: 126/61 140/60  Pulse: (!) 53 (!) 57  Resp: 20 18  Temp:  36.7 C  SpO2: 97% 98%    Last Pain:  Vitals:   07/09/20 1046  TempSrc:   PainSc: 0-No pain                 Beryle Lathe

## 2020-07-09 NOTE — Anesthesia Preprocedure Evaluation (Addendum)
Anesthesia Evaluation  Patient identified by MRN, date of birth, ID band Patient awake    Reviewed: Allergy & Precautions, NPO status , Patient's Chart, lab work & pertinent test results  History of Anesthesia Complications Negative for: history of anesthetic complications  Airway Mallampati: II  TM Distance: >3 FB Neck ROM: Full    Dental  (+) Dental Advisory Given, Teeth Intact   Pulmonary former smoker,    Pulmonary exam normal        Cardiovascular hypertension, Pt. on medications Normal cardiovascular exam     Neuro/Psych negative neurological ROS  negative psych ROS   GI/Hepatic Neg liver ROS, GERD  Medicated and Controlled,  Endo/Other   Obesity Pre-DM   Renal/GU negative Renal ROS     Musculoskeletal negative musculoskeletal ROS (+)   Abdominal   Peds  Hematology negative hematology ROS (+)   Anesthesia Other Findings Covid test negative   Reproductive/Obstetrics                            Anesthesia Physical Anesthesia Plan  ASA: II  Anesthesia Plan: General   Post-op Pain Management:    Induction: Intravenous  PONV Risk Score and Plan: 2 and Treatment may vary due to age or medical condition, Ondansetron and Dexamethasone  Airway Management Planned: Oral ETT  Additional Equipment: None  Intra-op Plan:   Post-operative Plan: Extubation in OR  Informed Consent: I have reviewed the patients History and Physical, chart, labs and discussed the procedure including the risks, benefits and alternatives for the proposed anesthesia with the patient or authorized representative who has indicated his/her understanding and acceptance.     Dental advisory given  Plan Discussed with: CRNA and Anesthesiologist  Anesthesia Plan Comments:        Anesthesia Quick Evaluation

## 2020-07-09 NOTE — Op Note (Signed)
DATE OF PROCEDURE: 07/09/2020  OPERATIVE REPORT   SURGEON: Newman Pies, MD   PREOPERATIVE DIAGNOSES:  1. Severe nasal septal deviation.  2. Bilateral inferior turbinate hypertrophy.  3. Chronic nasal obstruction.  POSTOPERATIVE DIAGNOSES:  1. Severe nasal septal deviation.  2. Bilateral inferior turbinate hypertrophy.  3. Chronic nasal obstruction.  PROCEDURE PERFORMED:  1. Septoplasty.  2. Bilateral partial inferior turbinate resection.   ANESTHESIA: General endotracheal tube anesthesia.   COMPLICATIONS: None.   ESTIMATED BLOOD LOSS: 125 mL.   INDICATION FOR PROCEDURE: Shane Cabrera is a 70 y.o. male with a history of chronic nasal obstruction. The patient was treated with antihistamine, decongestant, and steroid nasal sprays. However, the patient continued to be symptomatic. On examination, the patient was noted to have bilateral severe inferior turbinate hypertrophy and significant nasal septal deviation, causing significant nasal obstruction. Based on the above findings, the decision was made for the patient to undergo the above-stated procedures. The risks, benefits, alternatives, and details of the procedures were discussed with the patient. Questions were invited and answered. Informed consent was obtained.   DESCRIPTION OF PROCEDURE: The patient was taken to the operating room and placed supine on the operating table. General endotracheal tube anesthesia was administered by the anesthesiologist. The patient was positioned, and prepped and draped in the standard fashion for nasal surgery. Pledgets soaked with Afrin were placed in both nasal cavities for decongestion. The pledgets were subsequently removed.   Examination of the nasal cavity revealed a severe nasal septal deviation. 1% lidocaine with 1:100,000 epinephrine was injected onto the nasal septum bilaterally. A hemitransfixion incision was made on the left side. The mucosal flap was carefully elevated on the left side. A  cartilaginous incision was made 1 cm superior to the caudal margin of the nasal septum. Mucosal flap was also elevated on the right side in the similar fashion. It should be noted that due to the severe septal deviation, the deviated portion of the cartilaginous and bony septum had to be removed in piecemeal fashion. Once the deviated portions were removed, a straight midline septum was achieved. The septum was then quilted with 4-0 plain gut sutures. The hemitransfixion incision was closed with interrupted 4-0 chromic sutures.   The inferior one half of both hypertrophied inferior turbinate was crossclamped with a Kelly clamp. The inferior one half of each inferior turbinate was then resected with a pair of cross cutting scissors. Hemostasis was achieved with a suction cautery device. Doyle splints were applied to the nasal septum.  The care of the patient was turned over to the anesthesiologist. The patient was awakened from anesthesia without difficulty. The patient was extubated and transferred to the recovery room in good condition.   OPERATIVE FINDINGS: Nasal septal deviation and bilateral inferior turbinate hypertrophy.   SPECIMEN: None.   FOLLOWUP CARE: The patient be discharged home once he is awake and alert. The patient will be placed on Percocet p.r.n. pain, and amoxicillin 875 mg p.o. b.i.d. for 3 days. The patient will follow up in my office in 3 days for splint removal.   Shane Tollison Philomena Doheny, MD

## 2020-07-09 NOTE — Discharge Instructions (Addendum)
Post Anesthesia Home Care Instructions  Activity: Get plenty of rest for the remainder of the day. A responsible individual must stay with you for 24 hours following the procedure.  For the next 24 hours, DO NOT: -Drive a car -Operate machinery -Drink alcoholic beverages -Take any medication unless instructed by your physician -Make any legal decisions or sign important papers.  Meals: Start with liquid foods such as gelatin or soup. Progress to regular foods as tolerated. Avoid greasy, spicy, heavy foods. If nausea and/or vomiting occur, drink only clear liquids until the nausea and/or vomiting subsides. Call your physician if vomiting continues.  Special Instructions/Symptoms: Your throat may feel dry or sore from the anesthesia or the breathing tube placed in your throat during surgery. If this causes discomfort, gargle with warm salt water. The discomfort should disappear within 24 hours.  If you had a scopolamine patch placed behind your ear for the management of post- operative nausea and/or vomiting:  1. The medication in the patch is effective for 72 hours, after which it should be removed.  Wrap patch in a tissue and discard in the trash. Wash hands thoroughly with soap and water. 2. You may remove the patch earlier than 72 hours if you experience unpleasant side effects which may include dry mouth, dizziness or visual disturbances. 3. Avoid touching the patch. Wash your hands with soap and water after contact with the patch.    -----------------  POSTOPERATIVE INSTRUCTIONS FOR PATIENTS HAVING NASAL OR SINUS OPERATIONS ACTIVITY: Restrict activity at home for the first two days, resting as much as possible. Light activity is best. You may usually return to work within a week. You should refrain from nose blowing, strenuous activity, or heavy lifting greater than 20lbs for a total of one week after your operation.  If sneezing cannot be avoided, sneeze with your mouth  open. DISCOMFORT: You may experience a dull headache and pressure along with nasal congestion and discharge. These symptoms may be worse during the first week after the operation but may last as long as two to four weeks.  Please take Tylenol or the pain medication that has been prescribed for you. Do not take aspirin or aspirin containing medications since they may cause bleeding.  You may experience symptoms of post nasal drainage, nasal congestion, headaches and fatigue for two or three months after your operation.  BLEEDING: You may have some blood tinged nasal drainage for approximately two weeks after the operation.  The discharge will be worse for the first week.  Please call our office at [phone] or go to the nearest hospital emergency room if you experience any of the following: heavy, bright red blood from your nose or mouth that lasts longer than 15 minutes or coughing up or vomiting bright red blood or blood clots. GENERAL CONSIDERATIONS: 1. A gauze dressing will be placed on your upper lip to absorb any drainage after the operation. You may need to change this several times a day.  If you do not have very much drainage, you may remove the dressing.  Remember that you may gently wipe your nose with a tissue and sniff in, but DO NOT blow your nose. 2. Please keep all of your postoperative appointments.  Your final results after the operation will depend on proper follow-up.  The initial visit is usually 2 to 5 days after the operation.  During this visit, the remaining nasal packing and internal septal splints will be removed.  Your nasal and sinus cavities will   be cleaned.  During the second visit, your nasal and sinus cavities will be cleaned again. Have someone drive you to your first two postoperative appointments.  3. How you care for your nose after the operation will influence the results that you obtain.  You should follow all directions, take your medication as prescribed, and call  our office [phone] with any problems or questions. 4. You may be more comfortable sleeping with your head elevated on two pillows. 5. Do not take any medications that we have not prescribed or recommended. WARNING SIGNS: if any of the following should occur, please call our office: 1. Persistent fever greater than 102F. 2. Persistent vomiting. 3. Severe and constant pain that is not relieved by prescribed pain medication. 4. Trauma to the nose. 5. Rash or unusual side effects from any medicines.  

## 2020-07-10 ENCOUNTER — Encounter (HOSPITAL_BASED_OUTPATIENT_CLINIC_OR_DEPARTMENT_OTHER): Payer: Self-pay | Admitting: Otolaryngology

## 2020-07-10 NOTE — Addendum Note (Signed)
Addendum  created 07/10/20 0704 by Lance Coon, CRNA   Charge Capture section accepted

## 2020-07-28 ENCOUNTER — Emergency Department (HOSPITAL_COMMUNITY)
Admission: EM | Admit: 2020-07-28 | Discharge: 2020-07-29 | Disposition: A | Discharge status: Home / Self Care | Payer: Medicare Other | Attending: Emergency Medicine | Admitting: Emergency Medicine

## 2020-07-28 ENCOUNTER — Other Ambulatory Visit: Payer: Self-pay

## 2020-07-28 ENCOUNTER — Encounter (HOSPITAL_COMMUNITY): Payer: Self-pay

## 2020-07-28 DIAGNOSIS — I10 Essential (primary) hypertension: Secondary | ICD-10-CM | POA: Insufficient documentation

## 2020-07-28 DIAGNOSIS — Z79899 Other long term (current) drug therapy: Secondary | ICD-10-CM | POA: Insufficient documentation

## 2020-07-28 DIAGNOSIS — R040 Epistaxis: Secondary | ICD-10-CM | POA: Insufficient documentation

## 2020-07-28 DIAGNOSIS — Z87891 Personal history of nicotine dependence: Secondary | ICD-10-CM | POA: Insufficient documentation

## 2020-07-28 LAB — CBC WITH DIFFERENTIAL/PLATELET
Abs Immature Granulocytes: 0.02 10*3/uL (ref 0.00–0.07)
Basophils Absolute: 0.1 10*3/uL (ref 0.0–0.1)
Basophils Relative: 0 %
Eosinophils Absolute: 0.3 10*3/uL (ref 0.0–0.5)
Eosinophils Relative: 2 %
HCT: 42.5 % (ref 39.0–52.0)
Hemoglobin: 13.4 g/dL (ref 13.0–17.0)
Immature Granulocytes: 0 %
Lymphocytes Relative: 62 %
Lymphs Abs: 8.4 10*3/uL — ABNORMAL HIGH (ref 0.7–4.0)
MCH: 26.6 pg (ref 26.0–34.0)
MCHC: 31.5 g/dL (ref 30.0–36.0)
MCV: 84.5 fL (ref 80.0–100.0)
Monocytes Absolute: 0.8 10*3/uL (ref 0.1–1.0)
Monocytes Relative: 6 %
Neutro Abs: 4.1 10*3/uL (ref 1.7–7.7)
Neutrophils Relative %: 30 %
Platelets: 188 10*3/uL (ref 150–400)
RBC: 5.03 MIL/uL (ref 4.22–5.81)
RDW: 15.7 % — ABNORMAL HIGH (ref 11.5–15.5)
Smear Review: ADEQUATE
WBC: 13.6 10*3/uL — ABNORMAL HIGH (ref 4.0–10.5)
nRBC: 0 % (ref 0.0–0.2)

## 2020-07-28 LAB — BASIC METABOLIC PANEL
Anion gap: 7 (ref 5–15)
BUN: 17 mg/dL (ref 8–23)
CO2: 25 mmol/L (ref 22–32)
Calcium: 9.5 mg/dL (ref 8.9–10.3)
Chloride: 108 mmol/L (ref 98–111)
Creatinine, Ser: 1.37 mg/dL — ABNORMAL HIGH (ref 0.61–1.24)
GFR, Estimated: 55 mL/min — ABNORMAL LOW (ref >60)
Glucose, Bld: 101 mg/dL — ABNORMAL HIGH (ref 70–99)
Potassium: 4 mmol/L (ref 3.5–5.1)
Sodium: 140 mmol/L (ref 135–145)

## 2020-07-28 MED ORDER — BACITRACIN ZINC 500 UNIT/GM EX OINT: TOPICAL_OINTMENT | Freq: Two times a day (BID) | CUTANEOUS | Status: DC

## 2020-07-28 NOTE — ED Notes (Signed)
Provider ENT cart placed at pt's room

## 2020-07-28 NOTE — ED Provider Notes (Signed)
[facility] EMERGENCY DEPARTMENT Provider Note   CSN: [id number] Arrival date & time: 07/28/20  2024     History Chief Complaint  Patient presents with  . Epistaxis  . Post-op Problem    Shane Cabrera is a 70 y.o. male.  Patient presents with chief complaint of right-sided nasal epistaxis.  Symptoms began earlier today about hour prior to arrival.  Patient denies any digital trauma or any other trauma.  He states he was just sitting there when he started bleeding.  Denies fevers vomiting cough or diarrhea.  He had septal plasty done by ENT approximately 2 weeks prior to this.        Past Medical History:  Diagnosis Date  . Hyperlipidemia   . Hypertension   . Prediabetes     There are no problems to display for this patient.   Past Surgical History:  Procedure Laterality Date  . COLONOSCOPY  2016  . NASAL SEPTOPLASTY W/ TURBINOPLASTY Bilateral 07/09/2020   Procedure: NASAL SEPTOPLASTY WITH BILATERAL  TURBINATE REDUCTION;  Surgeon: Newman Pies, MD;  Location: [facility];  Service: ENT;  Laterality: Bilateral;       History reviewed. No pertinent family history.  Social History   Tobacco Use  . Smoking status: Former Games developer  . Smokeless tobacco: Never Used  Substance Use Topics  . Alcohol use: Yes    Comment: 3-4x per week  . Drug use: Not Currently    Home Medications Prior to Admission medications   Medication Sig Start Date End Date Taking? Authorizing Provider  diltiazem (TIAZAC) 420 MG 24 hr capsule Take 420 mg by mouth daily.    [provider]  fenofibrate 54 MG tablet Take 54 mg by mouth daily.    [provider]  fluticasone (FLONASE) 50 MCG/ACT nasal spray Place into both nostrils daily.    [provider]  lisinopril-hydrochlorothiazide (ZESTORETIC) 20-12.5 MG tablet Take 1 tablet by mouth daily.    [provider]  loratadine (CLARITIN) 10 MG tablet Take 10 mg by mouth daily.     [provider]  pantoprazole (PROTONIX) 40 MG tablet Take 40 mg by mouth daily.    [provider]  PARoxetine (PAXIL) 10 MG tablet Take 10 mg by mouth daily.    [provider]  rosuvastatin (CRESTOR) 20 MG tablet Take 20 mg by mouth daily.    [provider]  sildenafil (REVATIO) 20 MG tablet Take 20 mg by mouth 3 (three) times daily.    [provider]    Allergies    Montelukast  Review of Systems   Review of Systems  Constitutional: Negative for fever.  HENT: Negative for ear pain and sore throat.   Eyes: Negative for pain.  Respiratory: Negative for cough.   Cardiovascular: Negative for chest pain.  Gastrointestinal: Negative for abdominal pain.  Genitourinary: Negative for flank pain.  Musculoskeletal: Negative for back pain.  Skin: Negative for color change and rash.  Neurological: Negative for syncope.  All other systems reviewed and are negative.   Physical Exam Updated Vital Signs BP (!) 195/76   Pulse 81   Temp 99.7 F (37.6 C) (Oral)   Resp 19   Ht 5\' 1"  (1.549 m)   Wt 84.4 kg   SpO2 99%   BMI 35.14 kg/m   Physical Exam Constitutional:      General: He is not in acute distress.    Appearance: He is well-developed.  HENT:  Head: Normocephalic.     Nose: Nose normal.     Comments: Bleeding seen from the right nostril. Eyes:     Extraocular Movements: Extraocular movements intact.  Cardiovascular:     Rate and Rhythm: Normal rate.  Pulmonary:     Effort: Pulmonary effort is normal.  Skin:    Coloration: Skin is not jaundiced.  Neurological:     Mental Status: He is alert. Mental status is at baseline.     ED Results / Procedures / Treatments   Labs (all labs ordered are listed, but only abnormal results are displayed) Labs Reviewed  CBC WITH DIFFERENTIAL/PLATELET - Abnormal; Notable for the following components:      Result Value   WBC 13.6 (*)    RDW 15.7 (*)    Lymphs Abs 8.4 (*)    All  other components within normal limits  BASIC METABOLIC PANEL - Abnormal; Notable for the following components:   Glucose, Bld 101 (*)    Creatinine, Ser 1.37 (*)    GFR, Estimated 55 (*)    All other components within normal limits  PATHOLOGIST SMEAR REVIEW    EKG None  Radiology No results found.  Procedures .Epistaxis Management  Date/Time: 07/28/2020 11:58 PM Performed by: Cheryll Cockayne, MD Authorized by: Cheryll Cockayne, MD   Consent:    Consent obtained:  Verbal   Consent given by:  Patient   Risks, benefits, and alternatives were discussed: yes     Risks discussed:  Bleeding, infection, nasal injury and pain Comments:     Rhino Rocket's placed in each nostril.  Small amount of oozing seen in the right nostril but appears very mild.  Recommended follow-up with ENT in 1 or 2 days.     Medications Ordered in ED Medications  bacitracin ointment (has no administration in time range)    ED Course  I have reviewed the triage vital signs and the nursing notes.  Pertinent labs & imaging results that were available during my care of the patient were reviewed by me and considered in my medical decision making (see chart for details).    MDM Rules/Calculators/A&P                          Gauze packing placed in the right nostril with subsequent moderate improvement of the bleeding but still bleeding through.  At this point I discussed the case with ENT.  They are recommending Rhino Rocket and outpatient follow-up.  Right nostril Rhino Rocket has been placed.  Final Clinical Impression(s) / ED Diagnoses Final diagnoses:  Epistaxis    Rx / DC Orders ED Discharge Orders    None       Cheryll Cockayne, MD 07/28/20 2358

## 2020-07-28 NOTE — ED Triage Notes (Signed)
Emergency Medicine Provider Triage Evaluation Note  Cordelle Dahmen , a 70 y.o. male  was evaluated in triage.  Pt complains of epistaxis that started a few hours prior to arrival. Had septal surgery on 07/09/2020. Not currently on any blood thinners. No facial trauma.  Review of Systems  Positive: epistaxis   Physical Exam  There were no vitals taken for this visit. Gen:   Awake, no distress   HEENT:  Atraumatic  Resp:  Normal effort  Cardiac:  Normal rate  Abd:   Nondistended, nontender  MSK:   Moves extremities without difficulty  Neuro:  Speech clear   Medical Decision Making  Medically screening exam initiated at 8:30 PM.  Appropriate orders placed.  Cleda Clarks was informed that the remainder of the evaluation will be completed by another provider, this initial triage assessment does not replace that evaluation, and the importance of remaining in the ED until their evaluation is complete.  Clinical Impression  Epistaxis status post septal surgery on 4/5. No blood thinners.    Mannie Stabile, [place] 07/28/20 2031

## 2020-07-28 NOTE — ED Triage Notes (Signed)
Nose bleeding started after bending over.   Pt had recent surgery to nose about a week ago. Pt feels like he is about to choke.   Pt able to speak in full sentences but feels very uncomfortable. Pt has to clear his throat.

## 2020-07-29 LAB — PATHOLOGIST SMEAR REVIEW

## 2020-07-29 NOTE — Discharge Instructions (Addendum)
The nose packing in place.  Call your ENT doctor tomorrow.  Return immediately if you have worsening bleeding or any additional concerns.

## 2020-07-29 NOTE — ED Notes (Signed)
E-signature pad unavailable at time of pt discharge. This RN discussed discharge materials with pt and answered all pt questions. Pt stated understanding of discharge material. ? ?

## 2020-08-21 DIAGNOSIS — J4520 Mild intermittent asthma, uncomplicated: Secondary | ICD-10-CM | POA: Diagnosis not present

## 2020-08-21 DIAGNOSIS — J45909 Unspecified asthma, uncomplicated: Secondary | ICD-10-CM | POA: Diagnosis not present

## 2020-08-21 DIAGNOSIS — H269 Unspecified cataract: Secondary | ICD-10-CM | POA: Diagnosis not present

## 2020-08-21 DIAGNOSIS — E7800 Pure hypercholesterolemia, unspecified: Secondary | ICD-10-CM | POA: Diagnosis not present

## 2020-08-21 DIAGNOSIS — I10 Essential (primary) hypertension: Secondary | ICD-10-CM | POA: Diagnosis not present

## 2020-08-21 DIAGNOSIS — K219 Gastro-esophageal reflux disease without esophagitis: Secondary | ICD-10-CM | POA: Diagnosis not present

## 2020-10-23 DIAGNOSIS — J45909 Unspecified asthma, uncomplicated: Secondary | ICD-10-CM | POA: Diagnosis not present

## 2020-10-23 DIAGNOSIS — H269 Unspecified cataract: Secondary | ICD-10-CM | POA: Diagnosis not present

## 2020-10-23 DIAGNOSIS — I10 Essential (primary) hypertension: Secondary | ICD-10-CM | POA: Diagnosis not present

## 2020-10-23 DIAGNOSIS — J4520 Mild intermittent asthma, uncomplicated: Secondary | ICD-10-CM | POA: Diagnosis not present

## 2020-10-23 DIAGNOSIS — E7800 Pure hypercholesterolemia, unspecified: Secondary | ICD-10-CM | POA: Diagnosis not present

## 2020-10-23 DIAGNOSIS — K219 Gastro-esophageal reflux disease without esophagitis: Secondary | ICD-10-CM | POA: Diagnosis not present

## 2020-12-02 DIAGNOSIS — L821 Other seborrheic keratosis: Secondary | ICD-10-CM | POA: Diagnosis not present

## 2020-12-02 DIAGNOSIS — L820 Inflamed seborrheic keratosis: Secondary | ICD-10-CM | POA: Diagnosis not present

## 2020-12-12 DIAGNOSIS — J4520 Mild intermittent asthma, uncomplicated: Secondary | ICD-10-CM | POA: Diagnosis not present

## 2020-12-12 DIAGNOSIS — R7303 Prediabetes: Secondary | ICD-10-CM | POA: Diagnosis not present

## 2020-12-12 DIAGNOSIS — I10 Essential (primary) hypertension: Secondary | ICD-10-CM | POA: Diagnosis not present

## 2020-12-12 DIAGNOSIS — E7800 Pure hypercholesterolemia, unspecified: Secondary | ICD-10-CM | POA: Diagnosis not present

## 2020-12-12 DIAGNOSIS — J301 Allergic rhinitis due to pollen: Secondary | ICD-10-CM | POA: Diagnosis not present

## 2020-12-12 DIAGNOSIS — K219 Gastro-esophageal reflux disease without esophagitis: Secondary | ICD-10-CM | POA: Diagnosis not present

## 2020-12-16 DIAGNOSIS — R7303 Prediabetes: Secondary | ICD-10-CM | POA: Diagnosis not present

## 2020-12-16 DIAGNOSIS — E7800 Pure hypercholesterolemia, unspecified: Secondary | ICD-10-CM | POA: Diagnosis not present

## 2020-12-31 DIAGNOSIS — E7800 Pure hypercholesterolemia, unspecified: Secondary | ICD-10-CM | POA: Diagnosis not present

## 2020-12-31 DIAGNOSIS — K219 Gastro-esophageal reflux disease without esophagitis: Secondary | ICD-10-CM | POA: Diagnosis not present

## 2020-12-31 DIAGNOSIS — J45909 Unspecified asthma, uncomplicated: Secondary | ICD-10-CM | POA: Diagnosis not present

## 2020-12-31 DIAGNOSIS — J4520 Mild intermittent asthma, uncomplicated: Secondary | ICD-10-CM | POA: Diagnosis not present

## 2020-12-31 DIAGNOSIS — I10 Essential (primary) hypertension: Secondary | ICD-10-CM | POA: Diagnosis not present

## 2020-12-31 DIAGNOSIS — H269 Unspecified cataract: Secondary | ICD-10-CM | POA: Diagnosis not present

## 2021-01-13 DIAGNOSIS — H269 Unspecified cataract: Secondary | ICD-10-CM | POA: Diagnosis not present

## 2021-01-13 DIAGNOSIS — J45909 Unspecified asthma, uncomplicated: Secondary | ICD-10-CM | POA: Diagnosis not present

## 2021-01-13 DIAGNOSIS — K219 Gastro-esophageal reflux disease without esophagitis: Secondary | ICD-10-CM | POA: Diagnosis not present

## 2021-01-13 DIAGNOSIS — J4520 Mild intermittent asthma, uncomplicated: Secondary | ICD-10-CM | POA: Diagnosis not present

## 2021-01-13 DIAGNOSIS — E7800 Pure hypercholesterolemia, unspecified: Secondary | ICD-10-CM | POA: Diagnosis not present

## 2021-01-13 DIAGNOSIS — I10 Essential (primary) hypertension: Secondary | ICD-10-CM | POA: Diagnosis not present

## 2021-01-23 DIAGNOSIS — H905 Unspecified sensorineural hearing loss: Secondary | ICD-10-CM | POA: Diagnosis not present

## 2021-01-27 DIAGNOSIS — J343 Hypertrophy of nasal turbinates: Secondary | ICD-10-CM | POA: Diagnosis not present

## 2021-01-27 DIAGNOSIS — J310 Chronic rhinitis: Secondary | ICD-10-CM | POA: Diagnosis not present

## 2021-07-17 DIAGNOSIS — R030 Elevated blood-pressure reading, without diagnosis of hypertension: Secondary | ICD-10-CM | POA: Diagnosis not present

## 2021-07-17 DIAGNOSIS — I10 Essential (primary) hypertension: Secondary | ICD-10-CM | POA: Diagnosis not present

## 2021-07-17 DIAGNOSIS — K7401 Hepatic fibrosis, early fibrosis: Secondary | ICD-10-CM | POA: Diagnosis not present

## 2021-07-17 DIAGNOSIS — R7303 Prediabetes: Secondary | ICD-10-CM | POA: Diagnosis not present

## 2021-07-17 DIAGNOSIS — E7800 Pure hypercholesterolemia, unspecified: Secondary | ICD-10-CM | POA: Diagnosis not present

## 2021-07-17 DIAGNOSIS — R894 Abnormal immunological findings in specimens from other organs, systems and tissues: Secondary | ICD-10-CM | POA: Diagnosis not present

## 2021-07-17 DIAGNOSIS — K219 Gastro-esophageal reflux disease without esophagitis: Secondary | ICD-10-CM | POA: Diagnosis not present

## 2021-09-04 ENCOUNTER — Ambulatory Visit (HOSPITAL_BASED_OUTPATIENT_CLINIC_OR_DEPARTMENT_OTHER): Payer: Medicare Other | Admitting: Family

## 2021-09-11 ENCOUNTER — Ambulatory Visit (INDEPENDENT_AMBULATORY_CARE_PROVIDER_SITE_OTHER): Payer: Medicare Other | Admitting: Family

## 2021-09-11 ENCOUNTER — Encounter (HOSPITAL_BASED_OUTPATIENT_CLINIC_OR_DEPARTMENT_OTHER): Payer: Self-pay | Admitting: Family

## 2021-09-11 VITALS — BP 179/82 | HR 83 | Ht 61.0 in | Wt 174.0 lb

## 2021-09-11 DIAGNOSIS — K219 Gastro-esophageal reflux disease without esophagitis: Secondary | ICD-10-CM | POA: Diagnosis not present

## 2021-09-11 DIAGNOSIS — R072 Precordial pain: Secondary | ICD-10-CM

## 2021-09-11 DIAGNOSIS — R079 Chest pain, unspecified: Secondary | ICD-10-CM | POA: Diagnosis not present

## 2021-09-11 DIAGNOSIS — E782 Mixed hyperlipidemia: Secondary | ICD-10-CM | POA: Diagnosis not present

## 2021-09-11 DIAGNOSIS — I10 Essential (primary) hypertension: Secondary | ICD-10-CM

## 2021-09-11 MED ORDER — METOPROLOL TARTRATE 100 MG PO TABS: 100.0000 mg | ORAL_TABLET | Freq: Once | ORAL | 0 refills | Status: DC

## 2021-09-11 MED ORDER — HYDROCHLOROTHIAZIDE 25 MG PO TABS: 25.0000 mg | ORAL_TABLET | Freq: Every day | ORAL | 2 refills | Status: DC

## 2021-09-11 MED ORDER — VALSARTAN 160 MG PO TABS: 160.0000 mg | ORAL_TABLET | Freq: Every day | ORAL | 2 refills | Status: DC

## 2021-09-11 NOTE — Progress Notes (Signed)
[facility]pertension [facility] Initial Assessment:    Date:  09/11/2021   ID:  Shane Cabrera, DOB Aug 17, 1950, MRN [id number]  PCP:  Ileana Ladd, MD  Cardiologist:  None  Nephrologist:  Referring MD: Ileana Ladd, MD   CC: Hypertension  History of Present Illness:    Shane Cabrera is a 71 y.o. male with a hx of hypertension, depression anxiety, hyperlipidemia, allergic rhinitis, ED, prediabetes, GERD, BPH, hepatic fibrosis here to establish care in the [facility]pertension [facility].   Seen by his PCP 07/2021 and referred to [facility].  [name] was diagnosed with hypertension more than 10 years ago. It has been difficult to control. Blood pressure checked with arm cuff at home. Readings have been 163/70, 170/60. he reports tobacco use only in his teens. Alcohol use with beer 3-4 beers a few times per week. For exercise he enjoys walking when he finds that time. Usually once per week. Enjoys spending time in his yard with flowers. he eats at home and does follow low sodium diet. Reports no shortness of breath nor dyspnea on exertion. No edema, orthopnea, PND. Reports no palpitations.  Does note some indigestion with sensation of burning that occurs most often when he lays down. Does happen sometimes if he walks a long period of time but he keeps walking and it improves. He does snore but wakes feeling well rested.   Previous antihypertensives: None  Past Medical History:  Diagnosis Date   Hyperlipidemia    Hypertension    Prediabetes     Past Surgical History:  Procedure Laterality Date   COLONOSCOPY  2016   NASAL SEPTOPLASTY W/ TURBINOPLASTY Bilateral 07/09/2020   Procedure: NASAL SEPTOPLASTY WITH BILATERAL  TURBINATE REDUCTION;  Surgeon: Newman Pies, MD;  Location: [facility];  Service: ENT;  Laterality: Bilateral;    Current Medications: Current Meds  Medication Sig   diltiazem (DILACOR XR) 240 MG 24 hr capsule Take 480 mg by  mouth daily.   fenofibrate 54 MG tablet Take 54 mg by mouth daily.   fluticasone (FLONASE) 50 MCG/ACT nasal spray Place into both nostrils daily.   hydrochlorothiazide (HYDRODIURIL) 25 MG tablet Take 1 tablet (25 mg total) by mouth daily.   loratadine (CLARITIN) 10 MG tablet Take 10 mg by mouth daily.   metoprolol tartrate (LOPRESSOR) 100 MG tablet Take 1 tablet (100 mg total) by mouth once for 1 dose. Please take 2 hours prior to Cardiac CTA   pantoprazole (PROTONIX) 40 MG tablet Take 40 mg by mouth daily.   PARoxetine (PAXIL) 10 MG tablet Take 10 mg by mouth daily.   rosuvastatin (CRESTOR) 20 MG tablet Take 20 mg by mouth daily.   sildenafil (REVATIO) 20 MG tablet Take 20 mg by mouth as needed (ED).   valsartan (DIOVAN) 160 MG tablet Take 1 tablet (160 mg total) by mouth daily.   [DISCONTINUED] diltiazem (TIAZAC) 420 MG 24 hr capsule Take 420 mg by mouth daily.   [DISCONTINUED] lisinopril-hydrochlorothiazide (ZESTORETIC) 20-12.5 MG tablet Take 1 tablet by mouth daily.   [DISCONTINUED] sildenafil (REVATIO) 20 MG tablet Take 20 mg by mouth 3 (three) times daily.     Allergies:   Montelukast   Social History   Socioeconomic History   Marital status: Unknown    Spouse name: Not on file   Number of children: Not on file   Years of education: Not on file   Highest education level: Not on file  Occupational History   Not on  file  Tobacco Use   Smoking status: Former   Smokeless tobacco: Never  Substance and Sexual Activity   Alcohol use: Yes    Comment: 3-4x per week   Drug use: Not Currently   Sexual activity: Not on file  Other Topics Concern   Not on file  Social History Narrative   Not on file   Social Determinants of Health   Financial Resource Strain: Low Risk  (09/11/2021)   Overall Financial Resource Strain (CARDIA)    Difficulty of Paying Living Expenses: Not very hard  Food Insecurity: Food Insecurity Present (09/11/2021)   Hunger Vital Sign    Worried About Running  Out of Food in the Last Year: Sometimes true    Ran Out of Food in the Last Year: Sometimes true  Transportation Needs: No Transportation Needs (09/11/2021)   PRAPARE - Administrator, Civil Service (Medical): No    Lack of Transportation (Non-Medical): No  Physical Activity: Insufficiently Active (09/11/2021)   Exercise Vital Sign    Days of Exercise per Week: 1 day    Minutes of Exercise per Session: 10 min  Stress: No Stress Concern Present (09/11/2021)   [employer] of Occupational Health - Occupational Stress Questionnaire    Feeling of Stress : Only a little  Social Connections: Socially Integrated (09/11/2021)   Social Connection and Isolation Panel [NHANES]    Frequency of Communication with Friends and Family: Twice a week    Frequency of Social Gatherings with Friends and Family: Once a week    Attends Religious Services: More than 4 times per year    Active Member of [employer] or Organizations: Yes    Attends Engineer, structural: More than 4 times per year    Marital Status: Married     Family History: The patient's family history is notable for heart attack in mother, sister, brother. His mother also had HTN.   ROS:   Please see the history of present illness.     All other systems reviewed and are negative.  EKGs/Labs/Other Studies Reviewed:    EKG:  EKG is ordered today.  The ekg ordered today demonstrates NSR 83 bpm with no acute ST/T wave changes.   Recent Labs: 07/17/21 A1c 5.5 07/17/21 creatinine 1.02, GFR 78, K 3.8, na 141, AST 24, ALT 27  No results found for requested labs within last 365 days.  Recent Lipid Panel 07/2021 total cholesterol 183, HDL 79, triglycerides 54, LDL 94  No results found for: "CHOL", "TRIG", "HDL", "CHOLHDL", "VLDL", "LDLCALC", "LDLDIRECT"  Physical Exam:   VS:  BP (!) 179/82   Pulse 83   Ht 5\' 1"  (1.549 m)   Wt 174 lb (78.9 kg)   SpO2 99%   BMI 32.88 kg/m  , BMI Body mass index is 32.88 kg/m. GENERAL:   Well appearing HEENT: Pupils equal round and reactive, fundi not visualized, oral mucosa unremarkable NECK:  No jugular venous distention, waveform within normal limits, carotid upstroke brisk and symmetric, no bruits, no thyromegaly LYMPHATICS:  No cervical adenopathy LUNGS:  Clear to auscultation bilaterally HEART:  RRR.  PMI not displaced or sustained,S1 and S2 within normal limits, no S3, no S4, no clicks, no rubs,  murmurs ABD:  Flat, positive bowel sounds normal in frequency in pitch, no bruits, no rebound, no guarding, no midline pulsatile mass, no hepatomegaly, no splenomegaly EXT:  2 plus pulses throughout, no edema, no cyanosis no clubbing SKIN:  No rashes no nodules NEURO:  Cranial nerves II through XII grossly intact, motor grossly intact throughout PSYCH:  Cognitively intact, oriented to person place and time   ASSESSMENT/PLAN:    HTN -initial appointment to establish with hypertension clinic.  BP not at goal.  We will discontinue lisinopril-HCTZ.  Start valsartan 160 mg daily and HCTZ 25 mg daily.  Continue diltiazem 480 mg QD per PCP. At follow up if BP not at goal consider increased dose Valsartan. TSH, BMP in 1 week.  Consider sleep study at follow up - notes he snores but wakes feeling well rested.  Encouraged to reduce alcohol intake.  He was enrolled in ADV HTN study and provided [employer] RPM BP cuff today in clinic.  Referred to PREP at [facility] for exercise.   Chest pain in adult -reports chest pain when lying down after meals and also sometimes with walking.  It sometimes gets better as he continues walking.  Given family history of coronary disease, his age/ethnicity/gender, and known HTN/HLD plan for ischemic evaluation. Cardiac CTA ordered.   HLD -continue rosuvastatin 20 mg daily.  Cholesterol panel 07/2021 LDL 94.  If cardiac CTA with significant coronary calcification will require up titration  GERD - Continue to follow with PCP.  Continue Protonix 20 mg daily per PCP.   Encouraged to stay upright for at least 30 minutes prior to laying down to prevent recurrent symptoms.  Screening for Secondary Hypertension:     09/11/2021   12:58 PM  Causes  Sleep Apnea Screened     - Comments Snores but wakes well rested. Consider sleep study at follow up.  Thyroid Disease Screened     - Comments TSH ordered  Coarctation of the Aorta Screened     - Comments BP symmetrical  Compliance Screened    Relevant Labs/Studies:    Latest Ref Rng & Units 07/28/2020    9:24 PM 07/03/2020    1:04 PM  Basic Labs  Sodium 135 - 145 mmol/L 140  139   Potassium 3.5 - 5.1 mmol/L 4.0  4.7   Creatinine 0.61 - 1.24 mg/dL 5.91  6.38                        he consents to be monitored in our remote patient monitoring program through [employer].  he will track his blood pressure twice daily and understands that these trends will help [place] to adjust his medications as needed prior to his next appointment.  he is interested in enrolling in the PREP exercise and nutrition program through the [facility].     Disposition:    FU with PharmD in 2 months and in 4 months with Dr. Duke Salvia.    Medication Adjustments/Labs and Tests Ordered: Current medicines are reviewed at length with the patient today.  Concerns regarding medicines are outlined above.  Orders Placed This Encounter  Procedures   Basic metabolic panel   TSH   Amb Referral To Provider Referral Exercise Program (P.R.E.P)   EKG 12-Lead   Meds ordered this encounter  Medications   valsartan (DIOVAN) 160 MG tablet    Sig: Take 1 tablet (160 mg total) by mouth daily.    Dispense:  30 tablet    Refill:  2    Order Specific Question:   Supervising Provider    Answer:   Jodelle Red [[id number]]   hydrochlorothiazide (HYDRODIURIL) 25 MG tablet    Sig: Take 1 tablet (25 mg total) by mouth daily.    Dispense:  30  tablet    Refill:  2    Order Specific Question:   Supervising Provider    Answer:   Jodelle RedCHRISTOPHER, BRIDGETTE  [[id number]]   metoprolol tartrate (LOPRESSOR) 100 MG tablet    Sig: Take 1 tablet (100 mg total) by mouth once for 1 dose. Please take 2 hours prior to Cardiac CTA    Dispense:  1 tablet    Refill:  0     Signed, Alver Sorrowaitlin S Aneesh Faller, NP  09/11/2021 1:05 PM    [facility]

## 2021-09-11 NOTE — Patient Instructions (Addendum)
Medication Instructions:   Your physician has recommended you make the following change in your medication:   STOP Lisinopril-HCTZ  START Valsartan 160mg  one tablet daily  START HCTZ 25mg  one tablet daily   CONTINUE Diltiazem two 240mg  tablets daily    Labwork: Your physician recommends that you return for lab work 1 week: BMP, TSH  You may come to the...   [facility] (3rd floor) [address], [place], [place] [address]  Open: 8am-Noon and 1pm-4:30pm  Please ring the doorbell on the small table when you exit the elevator and the Lab Tech will come get you  [place] [facility] [address] [place], [place], [place] [address] Open: 8am-1pm, then 2pm-4:30pm   [place]- Please see attached locations sheet stapled to your lab work with address and hours.     Testing/Procedures: Your provider has recommended a Cardiac CTA.    Your cardiac CT will be scheduled at one of the below locations:   [facility] [address] [place], [place] [address] [phone]   If scheduled at [facility], please arrive at the [facility] (Entrance C2) of [facility] 30 minutes prior to test start time. You can use the FREE valet parking offered at entrance C (encouraged to control the heart rate for the test)  Proceed to the [facility] Radiology Department (first floor) to check-in and test prep.  All radiology patients and guests should use entrance C2 at [facility], accessed from [facility], even though the hospital's physical address listed is [address]    Please follow these instructions carefully (unless otherwise directed):   On the Night Before the Test: Be sure to Drink plenty of water. Do not consume any caffeinated/decaffeinated beverages or chocolate 12 hours prior to your test. Do not take any antihistamines 12 hours prior to your  test.  On the Day of the Test: Drink plenty of water until 1 hour prior to the test. Do not eat any food 4 hours prior to the test. You may take your regular medications prior to the test.  Take metoprolol (Lopressor) two hours prior to test. HOLD Furosemide/Hydrochlorothiazide morning of the test. FEMALES- please wear underwire-free bra if available, avoid dresses & tight clothing  After the Test: Drink plenty of water. After receiving IV contrast, you may experience a mild flushed feeling. This is normal. On occasion, you may experience a mild rash up to 24 hours after the test. This is not dangerous. If this occurs, you can take Benadryl 25 mg and increase your fluid intake. If you experience trouble breathing, this can be serious. If it is severe call 911 IMMEDIATELY. If it is mild, please call our office. If you take any of these medications: Glipizide/Metformin, Avandament, Glucavance, please do not take 48 hours after completing test unless otherwise instructed.  We will call to schedule your test 2-4 weeks out understanding that some insurance companies will need an authorization prior to the service being performed.   For non-scheduling related questions, please contact the cardiac imaging nurse navigator should you have any questions/concerns: Marchia Bond, Cardiac Imaging Nurse Navigator Gordy Clement, Cardiac Imaging Nurse Navigator [facility] Direct Office Dial: [phone]   For scheduling needs, including cancellations and rescheduling, please call [place], [phone].    Follow-Up: 2 months with [facility] August 23rd at 10am   4 months with Dr. Oval Linsey October 9th at 9:30am  Referrals:  You will receive a phone call from the PREP exercise and nutrition program to schedule an initial assessment.   Special Instructions:    DASH Eating Plan DASH stands for "Dietary Approaches to Stop Hypertension." The DASH eating plan is a  healthy eating plan that has been shown to reduce high blood pressure (hypertension). It may also reduce your risk for type 2 diabetes, heart disease, and stroke. The DASH eating plan may also help with weight loss. What are tips for following this plan?  General guidelines Avoid eating more than 2,300 mg (milligrams) of salt (sodium) a day. If you have hypertension, you may need to reduce your sodium intake to 1,500 mg a day. Limit alcohol intake to no more than 1 drink a day for nonpregnant women and 2 drinks a day for men. One drink equals 12 oz of beer, 5 oz of wine, or 1 oz of hard liquor. Work with your health care provider to maintain a healthy body weight or to lose weight. Ask what an ideal weight is for you. Get at least 30 minutes of exercise that causes your heart to beat faster (aerobic exercise) most days of the week. Activities may include walking, swimming, or biking. Work with your health care provider or diet and nutrition specialist (dietitian) to adjust your eating plan to your individual calorie needs. Reading food labels  Check food labels for the amount of sodium per serving. Choose foods with less than 5 percent of the Daily Value of sodium. Generally, foods with less than 300 mg of sodium per serving fit into this eating plan. To find whole grains, look for the word "whole" as the first word in the ingredient list. Shopping Buy products labeled as "low-sodium" or "no salt added." Buy fresh foods. Avoid canned foods and premade or frozen meals. Cooking Avoid adding salt when cooking. Use salt-free seasonings or herbs instead of table salt or sea salt. Check with your health care provider or pharmacist before using salt substitutes. Do not fry foods. Cook foods using healthy methods such as baking, boiling, grilling, and broiling instead. Cook with heart-healthy oils, such as olive, canola, soybean, or sunflower oil. Meal planning Eat a balanced diet that includes: 5 or  more servings of fruits and vegetables each day. At each meal, try to fill half of your plate with fruits and vegetables. Up to 6-8 servings of whole grains each day. Less than 6 oz of lean meat, poultry, or fish each day. A 3-oz serving of meat is about the same size as a deck of cards. One egg equals 1 oz. 2 servings of low-fat dairy each day. A serving of nuts, seeds, or beans 5 times each week. Heart-healthy fats. Healthy fats called Omega-3 fatty acids are found in foods such as flaxseeds and coldwater fish, like sardines, salmon, and mackerel. Limit how much you eat of the following: Canned or prepackaged foods. Food that is high in trans fat, such as fried foods. Food that is high in saturated fat, such as fatty meat. Sweets, desserts, sugary drinks, and other foods with added sugar. Full-fat dairy products. Do not salt foods before eating. Try to eat at least 2 vegetarian meals each week. Eat more home-cooked food and less restaurant, buffet, and fast food. When eating at a restaurant, ask that your food be prepared with less salt or no salt, if possible. What foods are recommended? The items listed may not be a complete list. Talk with your dietitian about what  dietary choices are best for you. Grains Whole-grain or whole-wheat bread. Whole-grain or whole-wheat pasta. Brown rice. Modena Morrow. Bulgur. Whole-grain and low-sodium cereals. Pita bread. Low-fat, low-sodium crackers. Whole-wheat flour tortillas. Vegetables Fresh or frozen vegetables (raw, steamed, roasted, or grilled). Low-sodium or reduced-sodium tomato and vegetable juice. Low-sodium or reduced-sodium tomato sauce and tomato paste. Low-sodium or reduced-sodium canned vegetables. Fruits All fresh, dried, or frozen fruit. Canned fruit in natural juice (without added sugar). Meat and other protein foods Skinless chicken or [place]. Ground chicken or [place]. Pork with fat trimmed off. Fish and seafood. Egg whites. Dried  beans, peas, or lentils. Unsalted nuts, nut butters, and seeds. Unsalted canned beans. Lean cuts of beef with fat trimmed off. Low-sodium, lean deli meat. Dairy Low-fat (1%) or fat-free (skim) milk. Fat-free, low-fat, or reduced-fat cheeses. Nonfat, low-sodium ricotta or cottage cheese. Low-fat or nonfat yogurt. Low-fat, low-sodium cheese. Fats and oils Soft margarine without trans fats. Vegetable oil. Low-fat, reduced-fat, or light mayonnaise and salad dressings (reduced-sodium). Canola, safflower, olive, soybean, and sunflower oils. Avocado. Seasoning and other foods Herbs. Spices. Seasoning mixes without salt. Unsalted popcorn and pretzels. Fat-free sweets. What foods are not recommended? The items listed may not be a complete list. Talk with your dietitian about what dietary choices are best for you. Grains Baked goods made with fat, such as croissants, muffins, or some breads. Dry pasta or rice meal packs. Vegetables Creamed or fried vegetables. Vegetables in a cheese sauce. Regular canned vegetables (not low-sodium or reduced-sodium). Regular canned tomato sauce and paste (not low-sodium or reduced-sodium). Regular tomato and vegetable juice (not low-sodium or reduced-sodium). Angie Fava. Olives. Fruits Canned fruit in a light or heavy syrup. Fried fruit. Fruit in cream or butter sauce. Meat and other protein foods Fatty cuts of meat. Ribs. Fried meat. Berniece Salines. Sausage. Bologna and other processed lunch meats. Salami. Fatback. Hotdogs. Bratwurst. Salted nuts and seeds. Canned beans with added salt. Canned or smoked fish. Whole eggs or egg yolks. Chicken or [place] with skin. Dairy Whole or 2% milk, cream, and half-and-half. Whole or full-fat cream cheese. Whole-fat or sweetened yogurt. Full-fat cheese. Nondairy creamers. Whipped toppings. Processed cheese and cheese spreads. Fats and oils Butter. Stick margarine. Lard. Shortening. Ghee. Bacon fat. Tropical oils, such as coconut, palm kernel, or  palm oil. Seasoning and other foods Salted popcorn and pretzels. Onion salt, garlic salt, seasoned salt, table salt, and sea salt. Worcestershire sauce. Tartar sauce. Barbecue sauce. Teriyaki sauce. Soy sauce, including reduced-sodium. Steak sauce. Canned and packaged gravies. Fish sauce. Oyster sauce. Cocktail sauce. Horseradish that you find on the shelf. Ketchup. Mustard. Meat flavorings and tenderizers. Bouillon cubes. Hot sauce and Tabasco sauce. Premade or packaged marinades. Premade or packaged taco seasonings. Relishes. Regular salad dressings. Where to find more information: National Heart, Lung, and [place]: https://Elya Tarquinio-eaton.com/ American Heart Association: www.heart.org Summary The DASH eating plan is a healthy eating plan that has been shown to reduce high blood pressure (hypertension). It may also reduce your risk for type 2 diabetes, heart disease, and stroke. With the DASH eating plan, you should limit salt (sodium) intake to 2,300 mg a day. If you have hypertension, you may need to reduce your sodium intake to 1,500 mg a day. When on the DASH eating plan, aim to eat more fresh fruits and vegetables, whole grains, lean proteins, low-fat dairy, and heart-healthy fats. Work with your health care provider or diet and nutrition specialist (dietitian) to adjust your eating plan to your individual calorie needs. This information is not  intended to replace advice given to you by your health care provider. Make sure you discuss any questions you have with your health care provider. Document Released: 03/12/2011 Document Revised: 03/05/2017 Document Reviewed: 03/16/2016 Elsevier Patient Education  2020 [employer].

## 2021-09-11 NOTE — Addendum Note (Signed)
Addended by: Alver Sorrow on: 09/11/2021 01:14 PM   Modules accepted: Orders

## 2021-09-12 ENCOUNTER — Telehealth: Payer: Self-pay | Admitting: Pharmacist Clinician (PhC)/ Clinical Pharmacy Specialist

## 2021-09-12 NOTE — Telephone Encounter (Signed)
-----   Message from Renaee Munda sent at 09/12/2021 12:58 PM EDT ----- Regarding: [employer] - Medication Questions Hi Belenda Cruise,  This patient sent a message in [employer] asking how long it's going to take for his medications to start working. He is new to [employer]. I'm just getting access to the new patient's because their was some sort of issue.  Thanks, Amy

## 2021-09-15 ENCOUNTER — Telehealth: Payer: Self-pay

## 2021-09-15 DIAGNOSIS — Z0000 Encounter for general adult medical examination without abnormal findings: Secondary | ICD-10-CM

## 2021-09-15 NOTE — Telephone Encounter (Signed)
Call patient for follow-up to enrollment to ensure that patient is not having any issues with checking his blood pressure as prompted. Patient stated that he does not have any concerns. Discussed with patient his referral to PREP and that someone will be reaching out to him soon. Offered health coaching per patient's survey response that he is not following an eating plan. Patient asked about how to read food labels properly, recommended daily allowance of sodium/sugar intake. Informed patient that he would learn about sugar consumption in PREP. Mailed patient information on reading labels and provided sodium tracker sheets. Will call patient back on 6/21 to ensure that he  has received the mail and to discuss sodium consumption. Patient questioned about lab testing sodium in his blood next week and what he should look for. Sent msg to [place] regarding patient's concern for further follow-up.  Krystianna Soth Nedra Hai, [facility] Guide, Health Coach [address], [address] [place] [address] Telephone: [phone] Email: [email or web address]

## 2021-09-16 ENCOUNTER — Telehealth (HOSPITAL_BASED_OUTPATIENT_CLINIC_OR_DEPARTMENT_OTHER): Payer: Self-pay | Admitting: *Deleted

## 2021-09-16 ENCOUNTER — Telehealth: Payer: Self-pay

## 2021-09-16 NOTE — Telephone Encounter (Signed)
-----   Message from Renaee Munda sent at 09/15/2021  5:11 PM EDT ----- Regarding: Labs Hi Lekisha Mcghee,  This patient asked me during a [employer] call about whether labs shows the sodium in his blood. I was not sure if that is something that they will be looking for or what to tell him the term/abbreviation to look for if they do. Could you reach out to him and discuss this concern he has? I am working with him to start monitoring his sodium intake.  Thanks, Amy

## 2021-09-16 NOTE — Telephone Encounter (Signed)
Called re: PREP program referral; left voicemail 

## 2021-09-16 NOTE — Telephone Encounter (Signed)
Lab work will show sodium level labeled 'Na' on the labs. However, this is not a good indicator of sodium in the diet. Recommend restricting to less than 2,000 mg of sodium in his diet. We really will be able to see the benefit of this as his blood pressure will become better controlled. PREP as well as the information Amy mailed him is a great way to know whether he is eating too much sodium.  Alver Sorrow, NP

## 2021-09-16 NOTE — Telephone Encounter (Signed)
Spoke with patient - was having trouble getting multiple PIN numbers.  Advised of number listed in [employer].    Answered questions about medication - will take up to 7-10 days to see full benefit.  Patient voiced understanding

## 2021-09-16 NOTE — Telephone Encounter (Signed)
Left message to call back  

## 2021-09-18 ENCOUNTER — Telehealth (HOSPITAL_BASED_OUTPATIENT_CLINIC_OR_DEPARTMENT_OTHER): Payer: Self-pay | Admitting: *Deleted

## 2021-09-18 DIAGNOSIS — I10 Essential (primary) hypertension: Secondary | ICD-10-CM | POA: Diagnosis not present

## 2021-09-18 NOTE — Telephone Encounter (Signed)
Patient came to office today having issues with his [employer] APP  After spending over an hour trying to help him patient went home  Patient and I both spoke with [employer] and patient was going to try to download APP again when he got home  I called patient to follow up, left message to call back

## 2021-09-19 ENCOUNTER — Telehealth (HOSPITAL_BASED_OUTPATIENT_CLINIC_OR_DEPARTMENT_OTHER): Payer: Self-pay

## 2021-09-19 DIAGNOSIS — I10 Essential (primary) hypertension: Secondary | ICD-10-CM

## 2021-09-19 LAB — BASIC METABOLIC PANEL
BUN/Creatinine Ratio: 18 (ref 10–24)
BUN: 19 mg/dL (ref 8–27)
CO2: 22 mmol/L (ref 20–29)
Calcium: 9.6 mg/dL (ref 8.6–10.2)
Chloride: 105 mmol/L (ref 96–106)
Creatinine, Ser: 1.03 mg/dL (ref 0.76–1.27)
Glucose: 102 mg/dL — ABNORMAL HIGH (ref 70–99)
Potassium: 3.9 mmol/L (ref 3.5–5.2)
Sodium: 140 mmol/L (ref 134–144)
eGFR: 77 mL/min/{1.73_m2} (ref >59)

## 2021-09-19 LAB — TSH: TSH: 1.51 u[IU]/mL (ref 0.450–4.500)

## 2021-09-19 MED ORDER — VALSARTAN 320 MG PO TABS: 320.0000 mg | ORAL_TABLET | Freq: Every day | ORAL | 3 refills | Status: DC

## 2021-09-19 NOTE — Addendum Note (Signed)
Addended by: Marlene Lard on: 09/19/2021 12:24 PM   Modules accepted: Orders

## 2021-09-19 NOTE — Telephone Encounter (Addendum)
Results called to patient who verbalizes understanding! Labs ordered and mailed, prescriptions updated and sent to pharmacy on file.     ----- Message from Alver Sorrow, NP sent at 09/19/2021  7:36 AM EDT ----- Normal kidney function, electrolytes, thyroid.  Good result! BP not yet at goal of less than 130/80. Recommend increase valsartan to 320 mg daily.  Repeat BMP in 2 weeks.

## 2021-09-24 NOTE — Telephone Encounter (Signed)
Left message to call back  

## 2021-09-24 NOTE — Telephone Encounter (Signed)
Spoke with patient and he is still not able to get VIV[employer] APP to work on phone Ordered home monitor kit be mailed to patient

## 2021-09-24 NOTE — Telephone Encounter (Signed)
Follow Up:     Please call, patient says

## 2021-09-24 NOTE — Telephone Encounter (Signed)
Patient was returning call. Please advise ?

## 2021-09-30 ENCOUNTER — Telehealth: Payer: Self-pay

## 2021-09-30 ENCOUNTER — Telehealth (HOSPITAL_COMMUNITY): Payer: Self-pay | Admitting: Emergency Medicine

## 2021-10-01 ENCOUNTER — Ambulatory Visit (HOSPITAL_COMMUNITY): Payer: Medicare Other

## 2021-10-01 ENCOUNTER — Telehealth (HOSPITAL_BASED_OUTPATIENT_CLINIC_OR_DEPARTMENT_OTHER): Payer: Self-pay | Admitting: *Deleted

## 2021-10-01 NOTE — Telephone Encounter (Signed)
Spoke with patient, he received his home [employer] blood pressure kit. He will bring his blood pressure monitor given at visit by the office

## 2021-10-01 NOTE — Telephone Encounter (Signed)
Late entry, has been discussed with patient

## 2021-10-02 ENCOUNTER — Telehealth: Payer: Self-pay | Admitting: *Deleted

## 2021-10-02 NOTE — Telephone Encounter (Signed)
RN returned to call to patient and provided the following recommendations, patient verbalizes understanding. Clarified with patient that he should be taking his blood pressure medication and then waiting two hours to check his BP.    Per Gillian Shields, NP   "Call to let know BP was better on recheck. Continue to monitor twice per day. Reminder to get labs today, Friday, or Monday as ordered. "

## 2021-10-02 NOTE — Telephone Encounter (Signed)
Received staff message from [facility].   Repeat BP 154/80. Just started new monitor 2 days ago with average BP 153/81. On 6/16 Valsartan was increased with plan for repeat labs in 2 weeks which will likely be collected tomorrow. Will readdress BP medications pending lab results.   Will ask RN to call to update him and remind him of labs.   Alver Sorrow, NP

## 2021-10-03 ENCOUNTER — Telehealth: Payer: Self-pay

## 2021-10-03 DIAGNOSIS — Z0000 Encounter for general adult medical examination without abnormal findings: Secondary | ICD-10-CM

## 2021-10-03 NOTE — Telephone Encounter (Signed)
Called patient to confirm that he is entering his readings manually. Informed patient that a ticket will be placed for technical support with [employer] and they will give him a call to troubleshoot his connection issues. Patient had no further questions or concerns.    Art Levan Nedra Hai, [facility] Guide, Health Coach [address] [place] [address] Telephone: [phone] Email: [email or web address]

## 2021-10-06 ENCOUNTER — Telehealth (HOSPITAL_BASED_OUTPATIENT_CLINIC_OR_DEPARTMENT_OTHER): Payer: Self-pay | Admitting: Family

## 2021-10-06 NOTE — Telephone Encounter (Signed)
RN called patient and provided the following recommendations. Patient states he has not had the best diet lately, but will get his labs done later this week!       "RPM reviewed. BP improved but not yet at goal of <130/80.  He is also due for repeat BMP as previously ordered. Will ask nursing team to contact him to remind him. Pending BMP results can make adjustments to medication regimen.    Alver Sorrow, NP "

## 2021-10-06 NOTE — Telephone Encounter (Signed)
RPM reviewed. BP improved but not yet at goal of <130/80.  He is also due for repeat BMP as previously ordered. Will as nursing team to contact him to remind him. Pending BMP results can make adjustments to medication regimen.   Alver Sorrow, NP

## 2021-10-10 ENCOUNTER — Telehealth (HOSPITAL_BASED_OUTPATIENT_CLINIC_OR_DEPARTMENT_OTHER): Payer: Self-pay | Admitting: *Deleted

## 2021-10-10 DIAGNOSIS — I10 Essential (primary) hypertension: Secondary | ICD-10-CM | POA: Diagnosis not present

## 2021-10-10 LAB — BASIC METABOLIC PANEL
BUN/Creatinine Ratio: 13 (ref 10–24)
BUN: 13 mg/dL (ref 8–27)
CO2: 23 mmol/L (ref 20–29)
Calcium: 9.8 mg/dL (ref 8.6–10.2)
Chloride: 105 mmol/L (ref 96–106)
Creatinine, Ser: 1 mg/dL (ref 0.76–1.27)
Glucose: 132 mg/dL — ABNORMAL HIGH (ref 70–99)
Potassium: 4.1 mmol/L (ref 3.5–5.2)
Sodium: 142 mmol/L (ref 134–144)
eGFR: 80 mL/min/{1.73_m2} (ref >59)

## 2021-10-10 NOTE — Telephone Encounter (Signed)
Received Derenda Mis machine from patient, he is currently enrolled in ADV HTN study  Left message to call back

## 2021-10-14 ENCOUNTER — Telehealth (HOSPITAL_BASED_OUTPATIENT_CLINIC_OR_DEPARTMENT_OTHER): Payer: Self-pay | Admitting: *Deleted

## 2021-10-14 ENCOUNTER — Telehealth: Payer: Self-pay

## 2021-10-14 DIAGNOSIS — I10 Essential (primary) hypertension: Secondary | ICD-10-CM

## 2021-10-14 DIAGNOSIS — Z5181 Encounter for therapeutic drug level monitoring: Secondary | ICD-10-CM

## 2021-10-14 NOTE — Telephone Encounter (Addendum)
Spoke with patient and the new blood pressure machine patient received is reading error-cuff Has been using the first [employer] blood pressure machine that was given to patient  Patient had dropped new monitor off, tested and read same error cuff  Will discuss with Dr Duke Salvia on what to do for the study

## 2021-10-14 NOTE — Telephone Encounter (Signed)
Shane Sorrow, NP  10/13/2021  1:01 PM EDT     Normal kidney function, electrolytes.  BP improved but not yet at goal of less than 130/80.  Recommend addition of spironolactone 12.5 mg daily with BMP in 1-2 weeks.   Discussed labs with patient   He is concerned this will interfere with the Sildenafil since it does not seem to be as effective as it was  He is getting ready to start PREP at the end of the month  Will forward to Ronn Melena NP for review

## 2021-10-14 NOTE — Telephone Encounter (Signed)
He returned my call re: PREP program; he wants to attend next class on July 31, every M/W 8:30-9:45; assessment visit scheduled for July 26  at 10am.

## 2021-10-15 NOTE — Telephone Encounter (Signed)
Patient had reached out through vivify to receive a call  Tried calling patient had to leave message to call back

## 2021-10-16 MED ORDER — SILDENAFIL CITRATE 50 MG PO TABS: 50.0000 mg | ORAL_TABLET | Freq: Every day | ORAL | 2 refills | Status: DC | PRN

## 2021-10-16 MED ORDER — SPIRONOLACTONE 25 MG PO TABS: ORAL_TABLET | ORAL | 1 refills | Status: DC

## 2021-10-16 NOTE — Telephone Encounter (Signed)
Advised patient, verbalized understanding  

## 2021-10-16 NOTE — Telephone Encounter (Signed)
His sildenafil is at a very low dose and that may be why it is not working as well. Would recommend Sildenafil 50mg  one hour prior to intercourse - okay to give 10 tabs with 2 refills. If does not improve, would follow up with PCP or urology.   Would recommend Spironolactone as discussed.  , NP

## 2021-10-16 NOTE — Addendum Note (Signed)
Addended by: Regis Bill B on: 10/16/2021 05:49 PM   Modules accepted: Orders

## 2021-10-17 ENCOUNTER — Telehealth (HOSPITAL_COMMUNITY): Payer: Self-pay | Admitting: Emergency Medicine

## 2021-10-17 NOTE — Telephone Encounter (Signed)
Reaching out to patient to offer assistance regarding upcoming cardiac imaging study; pt verbalizes understanding of appt date/time, parking situation and where to check in, pre-test NPO status and medications ordered, and verified current allergies; name and call back number provided for further questions should they arise [place] RN Navigator Cardiac Imaging [facility] [phone] office [phone] cell  Reminder to hold sildenfil 72 hr prior to scan 100mg  metoprolol 2hr prior  Denies iv issue Arrival 900

## 2021-10-20 ENCOUNTER — Ambulatory Visit (HOSPITAL_COMMUNITY)
Admission: RE | Admit: 2021-10-20 | Discharge: 2021-10-20 | Disposition: A | Discharge status: Home / Self Care | Payer: Medicare Other | Source: Ambulatory Visit | Attending: Family | Admitting: Family

## 2021-10-20 ENCOUNTER — Telehealth (HOSPITAL_BASED_OUTPATIENT_CLINIC_OR_DEPARTMENT_OTHER): Payer: Self-pay

## 2021-10-20 DIAGNOSIS — E782 Mixed hyperlipidemia: Secondary | ICD-10-CM

## 2021-10-20 DIAGNOSIS — R072 Precordial pain: Secondary | ICD-10-CM | POA: Diagnosis not present

## 2021-10-20 DIAGNOSIS — I10 Essential (primary) hypertension: Secondary | ICD-10-CM

## 2021-10-20 MED ORDER — NITROGLYCERIN 0.4 MG SL SUBL
0.8000 mg | SUBLINGUAL_TABLET | Freq: Once | SUBLINGUAL | Status: AC
  Administered 2021-10-20: 0.8 mg via SUBLINGUAL

## 2021-10-20 MED ORDER — ROSUVASTATIN CALCIUM 40 MG PO TABS: 40.0000 mg | ORAL_TABLET | Freq: Every day | ORAL | 3 refills | Status: DC

## 2021-10-20 MED ORDER — NITROGLYCERIN 0.4 MG SL SUBL
SUBLINGUAL_TABLET | SUBLINGUAL | Status: AC
  Filled 2021-10-20: qty 2

## 2021-10-20 MED ORDER — IOHEXOL 350 MG/ML SOLN
100.0000 mL | Freq: Once | INTRAVENOUS | Status: AC | PRN
  Administered 2021-10-20: 100 mL via INTRAVENOUS

## 2021-10-20 MED ORDER — ASPIRIN 81 MG PO TBEC: 81.0000 mg | DELAYED_RELEASE_TABLET | Freq: Every day | ORAL | 3 refills | Status: DC

## 2021-10-20 NOTE — Telephone Encounter (Addendum)
Results called to patient who verbalizes understanding! Rx to pharmacy. Lab Slips mailed to patient.    ----- Message from Alver Sorrow, NP sent at 10/20/2021  1:06 PM EDT ----- Cardiac CTA with coronary calcium score of 1.  Overall minimal nonobstructive coronary disease.  This is a good result!  Chest pain not related to blockage in heart arteries.  To optimize secondary prevention-increase rosuvastatin to 40 mg daily with repeat FLP/LFT in 8 weeks.Start Aspirin EC 81mg  QD.

## 2021-10-21 NOTE — Telephone Encounter (Signed)
Late entry - patient will continue to use the initial vivify blood pressure cuff given and just log into his APP [facility] per Dr Duke Salvia from study standpoint.

## 2021-10-24 ENCOUNTER — Telehealth (HOSPITAL_BASED_OUTPATIENT_CLINIC_OR_DEPARTMENT_OTHER): Payer: Self-pay

## 2021-10-24 NOTE — Telephone Encounter (Signed)
Received message from elink nurse that patient was having issues with his blood pressure cuff, RN returned call to discuss blood pressure cuff problems.   Patient states he is not having any problems today with his cuff today.   Patient would like his recent lab work results mailed to him before his next draw next week.

## 2021-10-28 NOTE — Progress Notes (Signed)
[facility] PREP Evaluation  Patient Details  Name: Shane Cabrera MRN: [id number] Date of Birth: Apr 04, 1951 Age: 71 y.o. PCP: Ileana Ladd, MD (Inactive)  Vitals:   10/28/21 0946  BP: 124/60  Pulse: 66  SpO2: 96%  Weight: 176 lb (79.8 kg)     [facility] Eval - 10/28/21 0900       [facility] "PREP" Location   [facility] "PREP" Location [facility]      Referral    Referring Provider C. Walker    Reason for referral High Cholesterol;Hypertension    Program Start Date 11/03/21      Measurement   Neck measurement 15.25 Inches    Waist Circumference 44.25 inches    Body fat 36.9 percent      Information for Trainer   Goals --   Lose 10-15 pounds by end of program; BP < 120/80 consistently by end of program   Current Exercise walking    Orthopedic Concerns --   back pain after back strain   Pertinent Medical History HTN    Medications that affect exercise Beta blocker      Timed Up and Go (TUGS)   Timed Up and Go Low risk <9 seconds      Mobility and Daily Activities   I find it easy to walk up or down two or more flights of stairs. 4    I have no trouble taking out the trash. 4    I do housework such as vacuuming and dusting on my own without difficulty. 4    I can easily lift a gallon of milk (8lbs). 4    I can easily walk a mile. 4    I have no trouble reaching into high cupboards or reaching down to pick up something from the floor. 4    I do not have trouble doing out-door work such as Loss adjuster, chartered, raking leaves, or gardening. 4      Mobility and Daily Activities   I feel younger than my age. 4    I feel independent. 4    I feel energetic. 4    I live an active life.  2    I feel strong. 2    I feel healthy. 2    I feel active as other people my age. 4      How fit and strong are you.   Fit and Strong Total Score 50            Past Medical History:  Diagnosis Date   Hyperlipidemia    Hypertension    Prediabetes    Past Surgical History:  Procedure  Laterality Date   COLONOSCOPY  2016   NASAL SEPTOPLASTY W/ TURBINOPLASTY Bilateral 07/09/2020   Procedure: NASAL SEPTOPLASTY WITH BILATERAL  TURBINATE REDUCTION;  Surgeon: Newman Pies, MD;  Location: [facility];  Service: ENT;  Laterality: Bilateral;   Social History   Tobacco Use  Smoking Status Former  Smokeless Tobacco Never  To start PREP Class at [facility] on July 31, every M/W 8:30-9:45a  Shane Cabrera B Shane Cabrera 10/28/2021, 9:50 AM

## 2021-11-03 ENCOUNTER — Encounter: Payer: Self-pay | Admitting: *Deleted

## 2021-11-03 NOTE — Progress Notes (Signed)
[facility] PRE[facility] Weekly Session  Patient Details  Name: Shane Cabrera MRN: [id number] Date of Birth: [birth year] Age: 71 y.o. PCP: Ileana Ladd, MD (Inactive)  There were no vitals filed for this visit.   [facility] Weekly seesion - 11/03/21 0900       [facility] "PREP" Location   [facility] "PREP" Location [facility]      Weekly Session   Topic Discussed Goal setting and welcome to the program   Tour of facility, review of workbook, opportunity to try out cardio machine.   Classes attended to date 1             Remo Lipps 11/03/2021, 9:53 AM

## 2021-11-26 ENCOUNTER — Ambulatory Visit (HOSPITAL_BASED_OUTPATIENT_CLINIC_OR_DEPARTMENT_OTHER): Payer: Medicare Other

## 2021-11-26 NOTE — Progress Notes (Deleted)
     11/26/2021 Cleda Clarks 1950-05-29 [id number]   HPI:  Shane Cabrera is a 71 y.o. male patient of Dr Duke Salvia, with a PMH below who presents today for hypertension clinic evaluation.  Past Medical History: hyperlipidemia 4/23 LDL 94  prediabetes A1c high as 5.8 in [facility]  Depression/anxiety   Hepatic fibrosis         Blood Pressure Goal:  130/80  Current Medications:  Family Hx:  Social Hx:   Diet:   Exercise:   Home BP readings: in VIvify  14 day average 135/73 (range 119-150/60-89)  HR 73  30 day average 134/73 (range 115-156/55-97)  HR 74  Intolerances: no cardiac medication intolerances  Labs: 10/10/21:  Na 142, K 4.1, Glu 132, BUN 13, SCr 1.0, GFR 80   Wt Readings from Last 3 Encounters:  10/28/21 176 lb (79.8 kg)  09/11/21 174 lb (78.9 kg)  07/28/20 186 lb (84.4 kg)   BP Readings from Last 3 Encounters:  10/28/21 124/60  10/20/21 (!) 168/83  09/11/21 (!) 179/82   Pulse Readings from Last 3 Encounters:  10/28/21 66  10/20/21 (!) 58  09/11/21 83    Current Outpatient Medications  Medication Sig Dispense Refill   aspirin EC 81 MG tablet Take 1 tablet (81 mg total) by mouth daily. Swallow whole. 90 tablet 3   diltiazem (DILACOR XR) 240 MG 24 hr capsule Take 480 mg by mouth daily.     fenofibrate 54 MG tablet Take 54 mg by mouth daily.     fluticasone (FLONASE) 50 MCG/ACT nasal spray Place into both nostrils daily.     hydrochlorothiazide (HYDRODIURIL) 25 MG tablet Take 1 tablet (25 mg total) by mouth daily. 30 tablet 2   loratadine (CLARITIN) 10 MG tablet Take 10 mg by mouth daily.     metoprolol tartrate (LOPRESSOR) 100 MG tablet Take 1 tablet (100 mg total) by mouth once for 1 dose. Please take 2 hours prior to Cardiac CTA 1 tablet 0   pantoprazole (PROTONIX) 40 MG tablet Take 40 mg by mouth daily.     PARoxetine (PAXIL) 10 MG tablet Take 10 mg by mouth daily.     rosuvastatin (CRESTOR) 40 MG tablet Take 1 tablet (40 mg total) by mouth daily.  90 tablet 3   sildenafil (VIAGRA) 50 MG tablet Take 1 tablet (50 mg total) by mouth daily as needed for erectile dysfunction. 10 tablet 2   spironolactone (ALDACTONE) 25 MG tablet 1/2 tablet daily 45 tablet 1   valsartan (DIOVAN) 320 MG tablet Take 1 tablet (320 mg total) by mouth daily. 90 tablet 3   No current facility-administered medications for this visit.    Allergies  Allergen Reactions   Montelukast Rash    Past Medical History:  Diagnosis Date   Hyperlipidemia    Hypertension    Prediabetes     There were no vitals taken for this visit.  No problem-specific Assessment & Plan notes found for this encounter.   Phillips Hay PharmD CPP [facility] [address] [place], [place] [address] [phone]

## 2021-12-01 NOTE — Progress Notes (Signed)
[facility] PRE[facility] Weekly Session  Patient Details  Name: Shane Cabrera MRN: [id number] Date of Birth: [birth year] Age: 71 y.o. PCP: Ileana Ladd, MD (Inactive)  Vitals:   12/01/21 1010  Weight: 174 lb 6.4 oz (79.1 kg)     [facility] Weekly seesion - 12/01/21 1000       [facility] "PREP" Location   [facility] "PREP" Location [facility]      Weekly Session   Topic Discussed Restaurant Eating   Na intake 1500-2300mg /day; salt demo   Classes attended to date 5             Corry Storie B Audry Pecina 12/01/2021, 10:11 AM

## 2021-12-06 ENCOUNTER — Other Ambulatory Visit (HOSPITAL_BASED_OUTPATIENT_CLINIC_OR_DEPARTMENT_OTHER): Payer: Self-pay | Admitting: Family

## 2021-12-06 DIAGNOSIS — I10 Essential (primary) hypertension: Secondary | ICD-10-CM

## 2021-12-25 ENCOUNTER — Telehealth (HOSPITAL_BASED_OUTPATIENT_CLINIC_OR_DEPARTMENT_OTHER): Payer: Self-pay

## 2021-12-25 LAB — BASIC METABOLIC PANEL
BUN/Creatinine Ratio: 13 (ref 10–24)
BUN: 14 mg/dL (ref 8–27)
CO2: 22 mmol/L (ref 20–29)
Calcium: 9.9 mg/dL (ref 8.6–10.2)
Chloride: 103 mmol/L (ref 96–106)
Creatinine, Ser: 1.06 mg/dL (ref 0.76–1.27)
Glucose: 113 mg/dL — ABNORMAL HIGH (ref 70–99)
Potassium: 4.3 mmol/L (ref 3.5–5.2)
Sodium: 139 mmol/L (ref 134–144)
eGFR: 74 mL/min/{1.73_m2} (ref >59)

## 2021-12-25 LAB — HEPATIC FUNCTION PANEL
ALT: 17 IU/L (ref 0–44)
AST: 18 IU/L (ref 0–40)
Albumin: 4 g/dL (ref 3.8–4.8)
Alkaline Phosphatase: 68 IU/L (ref 44–121)
Bilirubin Total: 0.6 mg/dL (ref 0.0–1.2)
Bilirubin, Direct: 0.21 mg/dL (ref 0.00–0.40)
Total Protein: 6.7 g/dL (ref 6.0–8.5)

## 2021-12-25 LAB — LIPID PANEL
Chol/HDL Ratio: 1.9 ratio (ref 0.0–5.0)
Cholesterol, Total: 130 mg/dL (ref 100–199)
HDL: 68 mg/dL (ref >39)
LDL Chol Calc (NIH): 47 mg/dL (ref 0–99)
Triglycerides: 72 mg/dL (ref 0–149)
VLDL Cholesterol Cal: 15 mg/dL (ref 5–40)

## 2021-12-25 NOTE — Telephone Encounter (Addendum)
Results called to patient who verbalizes understanding! Results mailed to patient at his request.   ----- Message from Loel Dubonnet, NP sent at 12/25/2021  7:27 AM EDT ----- Normal kidneys, liver, cholesterol panel at goal.  Good result!

## 2021-12-31 ENCOUNTER — Ambulatory Visit (HOSPITAL_BASED_OUTPATIENT_CLINIC_OR_DEPARTMENT_OTHER): Payer: Medicare Other

## 2022-01-07 DIAGNOSIS — L820 Inflamed seborrheic keratosis: Secondary | ICD-10-CM | POA: Diagnosis not present

## 2022-01-12 ENCOUNTER — Encounter: Payer: Self-pay | Admitting: *Deleted

## 2022-01-12 ENCOUNTER — Ambulatory Visit (HOSPITAL_BASED_OUTPATIENT_CLINIC_OR_DEPARTMENT_OTHER): Payer: Medicare Other | Admitting: Cardiovascular Disease

## 2022-01-12 NOTE — Progress Notes (Signed)
Received call from patient stating he would be unable to attend the final 3 classes of PREP due to his wife's appointment schedule. This 12 week session will conclude on !0/18/2023. He has only been able to attend 3 educational classes and 3 exercise classes.since the beginning of the course which started on 11/03/2021. He has had very limited ability to gain any benefit from the course content due to lack of attendance.

## 2022-01-26 ENCOUNTER — Ambulatory Visit (INDEPENDENT_AMBULATORY_CARE_PROVIDER_SITE_OTHER): Payer: Medicare Other | Admitting: Pharmacist Clinician (PhC)/ Clinical Pharmacy Specialist

## 2022-01-26 VITALS — BP 150/68 | HR 64 | Ht 61.0 in | Wt 183.6 lb

## 2022-01-26 DIAGNOSIS — I10 Essential (primary) hypertension: Secondary | ICD-10-CM | POA: Diagnosis not present

## 2022-01-26 HISTORY — DX: Essential (primary) hypertension: I10

## 2022-01-26 MED ORDER — SPIRONOLACTONE 25 MG PO TABS: 25.0000 mg | ORAL_TABLET | Freq: Every day | ORAL | 3 refills | Status: DC

## 2022-01-26 NOTE — Assessment & Plan Note (Signed)
Patient with essential hypertension, still not controlled on 3 medications.  Today will increase spironolactone to 25 mg daily, as he complains about cutting tablets in half.  He is on a very high dose of diltiazem, which is not a good choice when strictly for hypertension.  Patient has no history of AF or palpitations.  At next visit consider cutting that back and transitioning to amlodipine.  As part of the RPM research, he will see Dr. Oval Linsey in 2 months.

## 2022-01-26 NOTE — Patient Instructions (Addendum)
Return for a a follow up appointment Thursday November 30 at 8:30 am  Go to the lab in 2 weeks to check kidney function and potassium level (week of November 6)  Check your blood pressure at home daily and keep record of the readings.  Take your BP meds as follows:   Increase spironolactone to 25 mg (1 tablet) daily  Continue with all other medications.   Bring all of your meds, your BP cuff and your record of home blood pressures to your next appointment.  Exercise as you're able, try to walk approximately 30 minutes per day.  Keep salt intake to a minimum, especially watch canned and prepared boxed foods.  Eat more fresh fruits and vegetables and fewer canned items.  Avoid eating in fast food restaurants.    HOW TO TAKE YOUR BLOOD PRESSURE: Rest 5 minutes before taking your blood pressure.  Don't smoke or drink caffeinated beverages for at least 30 minutes before. Take your blood pressure before (not after) you eat. Sit comfortably with your back supported and both feet on the floor (don't cross your legs). Elevate your arm to heart level on a table or a desk. Use the proper sized cuff. It should fit smoothly and snugly around your bare upper arm. There should be enough room to slip a fingertip under the cuff. The bottom edge of the cuff should be 1 inch above the crease of the elbow. Ideally, take 3 measurements at one sitting and record the average.

## 2022-01-26 NOTE — Progress Notes (Signed)
01/26/2022 Sherre Poot Aug 03, 1950 [id number]   HPI:  Shane Cabrera is a 71 y.o. male patient of Dr Oval Linsey, with a [place] below who presents today for advanced hypertension clinic follow up.  Mr Maiello was seen in June by Laurann Montana NP, at which time his BP was 179/82.  Patient noted he was first diagnosed over 10 years ago and it has been always difficult to control.  When he saw Urban Gibson he was agreeable to our RPM research and randomized to group 2.  He was switched from lisinopril to valsartan 160 mg.  After a week his home readings were unchanged and spironolactone 12.5 mg daily was added.  He has had ongoing problems with the home monitoring system and is manually entering all readings into the app, still using the [employer] cuff he was given.    Past Medical History: Chest pain With lying down after meals, when walking; cardiac CTA ordered  hyperlipidemia 4/23 LDL 91 on rosuvastatin 20  GERD On pantoprazole 20 mg daily     Blood Pressure Goal:  130/80  Current Medications:   valsartan 160 mg, hctz 25 mg, diltiazem 480 mg qd (240 mg capsules), spironolactone 12.5 mg  Family Hx: both parents had hypertension, alcoholism    Social Hx: no tobacco, quit 35 years ago; has cut back on alcohol, now limits to 2 beers per day, several days each week, not much caffeine, some tea or coffee    Diet: home cooked meals, loves vegetables, meats are mostly beef, occasional chicken and fish; snacks on frozen fruit smoothies     Exercise: goes to gym occasionally  - swim and treadmill; trying to go once weekly  Home BP readings:  on Vivify  - 14 day average 135/73  (range 121-154/61-85)  HR 72 30 day average 136/72  (same range) HR 69  Intolerances:  montelukast - rash  Labs: 12/24/21  Na 139, K 4.3, Glu 113, BN 14, SCr 1.06, GFR 74   Wt Readings from Last 3 Encounters:  01/26/22 183 lb 10.3 oz (83.3 kg)  12/01/21 174 lb 6.4 oz (79.1 kg)  10/28/21 176 lb (79.8 kg)    BP Readings from Last 3 Encounters:  01/26/22 (!) 150/68  10/28/21 124/60  10/20/21 (!) 168/83   Pulse Readings from Last 3 Encounters:  01/26/22 64  10/28/21 66  10/20/21 (!) 58    Current Outpatient Medications  Medication Sig Dispense Refill   diltiazem (DILACOR XR) 240 MG 24 hr capsule Take 480 mg by mouth daily.     fenofibrate 54 MG tablet Take 54 mg by mouth daily.     fluticasone (FLONASE) 50 MCG/ACT nasal spray Place into both nostrils daily.     hydrochlorothiazide (HYDRODIURIL) 25 MG tablet TAKE 1 TABLET BY MOUTH DAILY 90 tablet 0   loratadine (CLARITIN) 10 MG tablet Take 10 mg by mouth daily.     pantoprazole (PROTONIX) 40 MG tablet Take 40 mg by mouth daily.     PARoxetine (PAXIL) 10 MG tablet Take 10 mg by mouth daily.     rosuvastatin (CRESTOR) 40 MG tablet Take 1 tablet (40 mg total) by mouth daily. 90 tablet 3   sildenafil (VIAGRA) 50 MG tablet Take 1 tablet (50 mg total) by mouth daily as needed for erectile dysfunction. 10 tablet 2   spironolactone (ALDACTONE) 25 MG tablet Take 1 tablet (25 mg total) by mouth daily. 90 tablet 3   valsartan (DIOVAN) 320 MG tablet Take 1  tablet (320 mg total) by mouth daily. 90 tablet 3   No current facility-administered medications for this visit.    Allergies  Allergen Reactions   Montelukast Rash    Past Medical History:  Diagnosis Date   Hyperlipidemia    Hypertension    Prediabetes     Blood pressure (!) 150/68, pulse 64, height 5\' 1"  (1.549 m), weight 183 lb 10.3 oz (83.3 kg).  HYPERTENSION CONTROL Vitals:   01/26/22 1106 01/26/22 1119  BP: (!) 154/65 (!) 150/68    The patient's blood pressure is elevated above target today.  In order to address the patient's elevated BP: A current anti-hypertensive medication was adjusted today.    Hypertension Patient with essential hypertension, still not controlled on 3 medications.  Today will increase spironolactone to 25 mg daily, as he complains about cutting  tablets in half.  He is on a very high dose of diltiazem, which is not a good choice when strictly for hypertension.  Patient has no history of AF or palpitations.  At next visit consider cutting that back and transitioning to amlodipine.  As part of the RPM research, he will see Dr. 01/28/22 in 2 months.     Duke Salvia PharmD CPP [facility] [address] [place], [place] [phone]

## 2022-02-13 DIAGNOSIS — I10 Essential (primary) hypertension: Secondary | ICD-10-CM | POA: Diagnosis not present

## 2022-02-14 LAB — BASIC METABOLIC PANEL
BUN/Creatinine Ratio: 16 (ref 10–24)
BUN: 17 mg/dL (ref 8–27)
CO2: 22 mmol/L (ref 20–29)
Calcium: 9.7 mg/dL (ref 8.6–10.2)
Chloride: 106 mmol/L (ref 96–106)
Creatinine, Ser: 1.08 mg/dL (ref 0.76–1.27)
Glucose: 92 mg/dL (ref 70–99)
Potassium: 4.2 mmol/L (ref 3.5–5.2)
Sodium: 141 mmol/L (ref 134–144)
eGFR: 72 mL/min/{1.73_m2} (ref >59)

## 2022-03-03 NOTE — Progress Notes (Signed)
[facility] Follow-up:    Date:  03/05/2022   ID:  Shane Cabrera, DOB 22-Oct-1950, MRN [id number]  PCP:  Ileana Ladd, MD (Inactive)  Cardiologist:  None  Nephrologist:  Referring MD: No ref. provider found   CC: Hypertension  History of Present Illness:    Shane Cabrera is a 71 y.o. male with a hx of hypertension, hyperlipidemia, prediabetes, GERD, allergic rhinitis, ED, BPH, hepatic fibrosis, depression, and anxiety, here for follow-up. He was initially seen 09/11/2021 by Gillian Shields, NP in the [facility]. He had been struggling with labile hypertension for more than 10 years. At home his BP was averaging 160s-170s/60s-70s. He was following a low sodium diet. Lisinopril-HCTZ was discontinued. Started on Valsartan 160 mg daily and HCTZ 25 mg daily. Continued on diltiazem 480 mg QD. He was enrolled in the Vivify RPM study. He was also referred to [facility]. Also, he had complained of chest pain when lying down after meals or while walking. A cardiac CTA was ordered and showed a coronary calcium score of 1 with minimal nonobstructive coronary disease. Rosuvastatin was increased to 40 mg daily, and he was started on 81 mg ASA daily.   Labs from 09/19/21 revealed normal kidney function, electrolytes, and thyroid. His home blood pressure readings were unchanged, so valsartan was increased to 320 mg daily. On 10/14/2021 blood pressures per Vivify RPM were still not at goal. Spironolactone 12.5 mg daily was added, but he also expressed concerns about medication interactions with sildenafil which was then increased to 50 mg.  He followed up with our pharmacist 01/26/2022 and his blood pressure was 150/68 on valsartan, HCTZ, diltiazem, and spironolactone. In Vivify his BP averaged 136/72 at home for the prior month. Spironolactone was increased to 25 mg as he had difficulty cutting tablets in half. Planned to consider cutting back his diltiazem and transition to  amlodipine at his next visit.   Today, he is feeling alright. He confirms that his blood pressures have been better controlled at home, confirmed per Vivify which shows averages of 120s/60s-70s. He does notice differences in his readings when eating saltier meals. Lately he has not been experiencing any kind of chest pain or discomfort. However he does complain of intermittent, generic aches and pains. Occasionally a muscle in his LLE will "give out" depending on how he stands, walks, or twists his leg. He is continuing to exercise but not as frequently. About 3-4 times a day he is climbing stairs or inclines with carrying 20 lbs. He denies anginal or other significant symptoms. Previously he lost weight with more consistent 30-minute walks; he plans to work on returning to this routine. Additionally he complains of a muscle knot in his right, posterior neck that causes discomfort with certain neck movements. He denies any palpitations, shortness of breath, or peripheral edema. No lightheadedness, headaches, syncope, orthopnea, or PND.  Previous antihypertensives: Lisinopril-HCTZ  Past Medical History:  Diagnosis Date   CAD in native artery 03/05/2022   Hyperlipidemia    Hypertension    Prediabetes    White coat syndrome with diagnosis of hypertension 01/26/2022    Past Surgical History:  Procedure Laterality Date   COLONOSCOPY  2016   NASAL SEPTOPLASTY W/ TURBINOPLASTY Bilateral 07/09/2020   Procedure: NASAL SEPTOPLASTY WITH BILATERAL  TURBINATE REDUCTION;  Surgeon: Newman Pies, MD;  Location: [facility];  Service: ENT;  Laterality: Bilateral;    Current Medications: Current Meds  Medication Sig   diltiazem (DILACOR XR) 240 MG  24 hr capsule Take 480 mg by mouth daily.   fenofibrate 54 MG tablet Take 54 mg by mouth daily.   fluticasone (FLONASE) 50 MCG/ACT nasal spray Place into both nostrils daily.   hydrochlorothiazide (HYDRODIURIL) 25 MG tablet TAKE 1 TABLET BY MOUTH DAILY    loratadine (CLARITIN) 10 MG tablet Take 10 mg by mouth daily.   pantoprazole (PROTONIX) 40 MG tablet Take 40 mg by mouth daily.   PARoxetine (PAXIL) 10 MG tablet Take 10 mg by mouth daily.   rosuvastatin (CRESTOR) 40 MG tablet Take 1 tablet (40 mg total) by mouth daily.   sildenafil (VIAGRA) 50 MG tablet Take 1 tablet (50 mg total) by mouth daily as needed for erectile dysfunction.   spironolactone (ALDACTONE) 25 MG tablet Take 1 tablet (25 mg total) by mouth daily.   tadalafil (CIALIS) 20 MG tablet Take 20 mg by mouth daily as needed for erectile dysfunction.   valsartan (DIOVAN) 320 MG tablet Take 1 tablet (320 mg total) by mouth daily.     Allergies:   Montelukast   Social History   Socioeconomic History   Marital status: Unknown    Spouse name: Not on file   Number of children: Not on file   Years of education: Not on file   Highest education level: Not on file  Occupational History   Not on file  Tobacco Use   Smoking status: Former   Smokeless tobacco: Never  Substance and Sexual Activity   Alcohol use: Yes    Comment: 3-4x per week   Drug use: Not Currently   Sexual activity: Not on file  Other Topics Concern   Not on file  Social History Narrative   Not on file   Social Determinants of Health   Financial Resource Strain: Low Risk  (09/11/2021)   Overall Financial Resource Strain (CARDIA)    Difficulty of Paying Living Expenses: Not very hard  Food Insecurity: Food Insecurity Present (09/11/2021)   Hunger Vital Sign    Worried About Running Out of Food in the Last Year: Sometimes true    Ran Out of Food in the Last Year: Sometimes true  Transportation Needs: No Transportation Needs (09/11/2021)   PRAPARE - Administrator, Civil Service (Medical): No    Lack of Transportation (Non-Medical): No  Physical Activity: Insufficiently Active (09/11/2021)   Exercise Vital Sign    Days of Exercise per Week: 1 day    Minutes of Exercise per Session: 10 min   Stress: No Stress Concern Present (09/11/2021)   [employer] of Occupational Health - Occupational Stress Questionnaire    Feeling of Stress : Only a little  Social Connections: Socially Integrated (09/11/2021)   Social Connection and Isolation Panel [NHANES]    Frequency of Communication with Friends and Family: Twice a week    Frequency of Social Gatherings with Friends and Family: Once a week    Attends Religious Services: More than 4 times per year    Active Member of [employer] or Organizations: Yes    Attends Engineer, structural: More than 4 times per year    Marital Status: Married     Family History: The patient's family history includes Heart attack in his brother, mother, and sister; Hypertension in his mother.  ROS:   Please see the history of present illness.    (+) Myalgias All other systems reviewed and are negative.  EKGs/Labs/Other Studies Reviewed:    Coronary CTA  10/20/2021: IMPRESSION: 1. Coronary  calcium score of 1.0. This was 13th percentile for age-, sex, and race-matched controls.   2. Normal coronary origin with right dominance.   3. Minimal mixed density plaque (<25%) in the LAD.   4. Minimal non-calcified plaque (<25%) in the ramus.   5. Normal LCX/RCA.  US Aorta Medicare Screening  11/19/2017: IMPRESSION: 1. Mild ectasia of the abdominal aorta measuring 2.8 cm in maximal diameter. Recommend follow-up aortic ultrasound in 5 years. This recommendation follows ACR consensus guidelines: White Paper of the ACR Incidental Findings Committee II on Vascular Findings. J Am Coll Radiol 2013; 10:789-794. 2.  Aortic Atherosclerosis (ICD10-I70.0).   EKG:  EKG is personally reviewed. 03/03/2022: EKG was not ordered. 09/11/2021 Gillian Shields(Caitlin Walker, NP): NSR 83 bpm with no acute ST/T wave changes.     Recent Labs: 09/18/2021: TSH 1.510 12/24/2021: ALT 17 02/13/2022: BUN 17; Creatinine, Ser 1.08; Potassium 4.2; Sodium 141   Recent Lipid Panel     Component Value Date/Time   CHOL 130 12/24/2021 0821   TRIG 72 12/24/2021 0821   HDL 68 12/24/2021 0821   CHOLHDL 1.9 12/24/2021 0821   LDLCALC 47 12/24/2021 0821    Physical Exam:    VS:  BP (!) 146/68 (BP Location: Left Arm, Patient Position: Sitting, Cuff Size: Normal)   Pulse 65   Ht 5\' 4"  (1.626 m)   Wt 184 lb 8 oz (83.7 kg)   SpO2 100%   BMI 31.67 kg/m  , BMI Body mass index is 31.67 kg/m. GENERAL:  Well appearing HEENT: Pupils equal round and reactive, fundi not visualized, oral mucosa unremarkable NECK:  No jugular venous distention, waveform within normal limits, carotid upstroke brisk and symmetric, no bruits, no thyromegaly. LUNGS:  Clear to auscultation bilaterally HEART:  RRR.  PMI not displaced or sustained,S1 and S2 within normal limits, no S3, no S4, no clicks, no rubs, no murmurs ABD:  Flat, positive bowel sounds normal in frequency in pitch, no bruits, no rebound, no guarding, no midline pulsatile mass, no hepatomegaly, no splenomegaly EXT:  2 plus pulses throughout, no edema, no cyanosis no clubbing SKIN:  No rashes no nodules NEURO:  Cranial nerves II through XII grossly intact, motor grossly intact throughout PSYCH:  Cognitively intact, oriented to person place and time   ASSESSMENT/PLAN:    White coat syndrome with diagnosis of hypertension Shane Cabrera has completed the remote patient monitoring study.  In vivify his home blood pressures averaging in the 120s over 70s.  His blood pressure in the office was much higher both initially and on repeat.  He has whitecoat hypertension superimposed on essential hypertension.  Given that his home pressures are well controlled will not make any changes at this time.  Continue valsartan, spironolactone, HCTZ, and diltiazem.  We discussed the importance of increasing his exercise to least 150 minutes weekly and losing weight.  He expressed understanding.  CAD in native artery Minimal plaque was seen on his coronary  CTA in 2023.  He is on rosuvastatin and lipids are very well controlled.  Increasing exercise and working on diet as above.  LDL goal is less than 70.   Screening for Secondary Hypertension:     09/11/2021   12:58 PM  Causes  Sleep Apnea Screened     - Comments Snores but wakes well rested. Consider sleep study at follow up.  Thyroid Disease Screened     - Comments TSH ordered  Coarctation of the Aorta Screened     - Comments BP symmetrical  Compliance Screened    Relevant Labs/Studies:    Latest Ref Rng & Units 02/13/2022    9:04 AM 12/24/2021    8:21 AM 10/10/2021    9:25 AM  Basic Labs  Sodium 134 - 144 mmol/L 141  139  142   Potassium 3.5 - 5.2 mmol/L 4.2  4.3  4.1   Creatinine 0.76 - 1.27 mg/dL 4.82  7.07  8.67        Latest Ref Rng & Units 09/18/2021    8:08 AM  Thyroid   TSH 0.450 - 4.500 uIU/mL 1.510     Disposition:    FU with Camy Leder C. Duke Salvia, MD, [facility] in 6 months.   Medication Adjustments/Labs and Tests Ordered: Current medicines are reviewed at length with the patient today.  Concerns regarding medicines are outlined above.   No orders of the defined types were placed in this encounter.  No orders of the defined types were placed in this encounter.  I,Mathew Stumpf,acting as a Neurosurgeon for Chilton Si, MD.,have documented all relevant documentation on the behalf of Chilton Si, MD,as directed by  Chilton Si, MD while in the presence of Chilton Si, MD.  I, Phyllicia Dudek C. Duke Salvia, MD have reviewed all documentation for this visit.  The documentation of the exam, diagnosis, procedures, and orders on 03/05/2022 are all accurate and complete.   Signed, Chilton Si, MD  03/05/2022 9:52 AM    [facility]

## 2022-03-05 ENCOUNTER — Encounter (HOSPITAL_BASED_OUTPATIENT_CLINIC_OR_DEPARTMENT_OTHER): Payer: Self-pay | Admitting: Cardiovascular Disease

## 2022-03-05 ENCOUNTER — Telehealth (HOSPITAL_BASED_OUTPATIENT_CLINIC_OR_DEPARTMENT_OTHER): Payer: Self-pay | Admitting: *Deleted

## 2022-03-05 ENCOUNTER — Ambulatory Visit (INDEPENDENT_AMBULATORY_CARE_PROVIDER_SITE_OTHER): Payer: Medicare Other | Admitting: Cardiovascular Disease

## 2022-03-05 VITALS — BP 146/68 | HR 65 | Ht 64.0 in | Wt 184.5 lb

## 2022-03-05 DIAGNOSIS — I10 Essential (primary) hypertension: Secondary | ICD-10-CM

## 2022-03-05 DIAGNOSIS — Z006 Encounter for examination for normal comparison and control in clinical research program: Secondary | ICD-10-CM

## 2022-03-05 DIAGNOSIS — I2510 Atherosclerotic heart disease of native coronary artery without angina pectoris: Secondary | ICD-10-CM | POA: Diagnosis not present

## 2022-03-05 HISTORY — DX: Atherosclerotic heart disease of native coronary artery without angina pectoris: I25.10

## 2022-03-05 NOTE — Research (Signed)
I saw Shane Cabrera today after Dr. Woodbranch's follow up visit. Shane Cabrera is in Dr. Grover Hill's Virtual Care HTN Study. Shane Cabrera filled out research survey. Shane Cabrera was enrolled in Group 2. Shane Cabrera has successfully reached his blood pressure goal and completed the Virtual Care HTN Study. 

## 2022-03-05 NOTE — Addendum Note (Signed)
Addended by: Chilton Si C on: 03/05/2022 11:02 AM   Modules accepted: Orders

## 2022-03-05 NOTE — Assessment & Plan Note (Signed)
Minimal plaque was seen on his coronary CTA in 2023.  He is on rosuvastatin and lipids are very well controlled.  Increasing exercise and working on diet as above.  LDL goal is less than 70.

## 2022-03-05 NOTE — Telephone Encounter (Signed)
Patient in for office visit today.  He has completed Vivify study Did bring blood pressure machine but received full home kit He will bring the remainder of kit by the office soon

## 2022-03-05 NOTE — Patient Instructions (Signed)
Medication Instructions:  Your physician recommends that you continue on your current medications as directed. Please refer to the Current Medication list given to you today.   Labwork: NONE  Testing/Procedures: NONE  Follow-Up: 09/03/2022 9:00 am with Dr Oval Linsey   Any Other Special Instructions Will Be Listed Below (If Applicable). AS SOON AS YOU CAN GET BY THE OFFICE BRING THE REST OF THE KIT THAT WAS SENT TO YOU   If you need a refill on your cardiac medications before your next appointment, please call your pharmacy.

## 2022-03-05 NOTE — Assessment & Plan Note (Signed)
Mr. Solorio has completed the remote patient monitoring study.  In vivify his home blood pressures averaging in the 120s over 70s.  His blood pressure in the office was much higher both initially and on repeat.  He has whitecoat hypertension superimposed on essential hypertension.  Given that his home pressures are well controlled will not make any changes at this time.  Continue valsartan, spironolactone, HCTZ, and diltiazem.  We discussed the importance of increasing his exercise to least 150 minutes weekly and losing weight.  He expressed understanding.

## 2022-03-06 ENCOUNTER — Encounter (HOSPITAL_BASED_OUTPATIENT_CLINIC_OR_DEPARTMENT_OTHER): Payer: Self-pay | Admitting: Cardiovascular Disease

## 2022-03-07 ENCOUNTER — Other Ambulatory Visit (HOSPITAL_BASED_OUTPATIENT_CLINIC_OR_DEPARTMENT_OTHER): Payer: Self-pay | Admitting: Family

## 2022-03-07 DIAGNOSIS — I10 Essential (primary) hypertension: Secondary | ICD-10-CM

## 2022-03-09 NOTE — Telephone Encounter (Signed)
Rx request sent to pharmacy.  

## 2022-03-10 ENCOUNTER — Encounter (HOSPITAL_BASED_OUTPATIENT_CLINIC_OR_DEPARTMENT_OTHER): Payer: Self-pay | Admitting: Pharmacist Clinician (PhC)/ Clinical Pharmacy Specialist

## 2022-03-12 NOTE — Telephone Encounter (Signed)
Full kit received from patient

## 2022-03-13 ENCOUNTER — Other Ambulatory Visit: Payer: Self-pay | Admitting: Gastroenterology

## 2022-03-13 DIAGNOSIS — K7400 Hepatic fibrosis, unspecified: Secondary | ICD-10-CM | POA: Diagnosis not present

## 2022-03-13 DIAGNOSIS — R894 Abnormal immunological findings in specimens from other organs, systems and tissues: Secondary | ICD-10-CM | POA: Diagnosis not present

## 2022-03-18 DIAGNOSIS — H35033 Hypertensive retinopathy, bilateral: Secondary | ICD-10-CM | POA: Diagnosis not present

## 2022-03-18 DIAGNOSIS — I10 Essential (primary) hypertension: Secondary | ICD-10-CM | POA: Diagnosis not present

## 2022-03-18 DIAGNOSIS — H524 Presbyopia: Secondary | ICD-10-CM | POA: Diagnosis not present

## 2022-03-18 DIAGNOSIS — H1013 Acute atopic conjunctivitis, bilateral: Secondary | ICD-10-CM | POA: Diagnosis not present

## 2022-03-18 DIAGNOSIS — H26493 Other secondary cataract, bilateral: Secondary | ICD-10-CM | POA: Diagnosis not present

## 2022-03-20 DIAGNOSIS — E7800 Pure hypercholesterolemia, unspecified: Secondary | ICD-10-CM | POA: Diagnosis not present

## 2022-03-20 DIAGNOSIS — R7303 Prediabetes: Secondary | ICD-10-CM | POA: Diagnosis not present

## 2022-03-20 DIAGNOSIS — I10 Essential (primary) hypertension: Secondary | ICD-10-CM | POA: Diagnosis not present

## 2022-03-20 DIAGNOSIS — I700 Atherosclerosis of aorta: Secondary | ICD-10-CM | POA: Diagnosis not present

## 2022-04-01 ENCOUNTER — Ambulatory Visit
Admission: RE | Admit: 2022-04-01 | Discharge: 2022-04-01 | Disposition: A | Discharge status: Home / Self Care | Payer: Medicare Other | Source: Ambulatory Visit | Attending: Gastroenterology | Admitting: Gastroenterology

## 2022-04-01 DIAGNOSIS — K7400 Hepatic fibrosis, unspecified: Secondary | ICD-10-CM

## 2022-04-14 ENCOUNTER — Other Ambulatory Visit (HOSPITAL_BASED_OUTPATIENT_CLINIC_OR_DEPARTMENT_OTHER): Payer: Self-pay | Admitting: Family

## 2022-04-14 NOTE — Telephone Encounter (Signed)
Rx request sent to pharmacy.  

## 2022-04-29 DIAGNOSIS — K2930 Chronic superficial gastritis without bleeding: Secondary | ICD-10-CM | POA: Diagnosis not present

## 2022-04-29 DIAGNOSIS — B9681 Helicobacter pylori [H. pylori] as the cause of diseases classified elsewhere: Secondary | ICD-10-CM | POA: Diagnosis not present

## 2022-04-29 DIAGNOSIS — K5730 Diverticulosis of large intestine without perforation or abscess without bleeding: Secondary | ICD-10-CM | POA: Diagnosis not present

## 2022-04-29 DIAGNOSIS — Z1211 Encounter for screening for malignant neoplasm of colon: Secondary | ICD-10-CM | POA: Diagnosis not present

## 2022-04-29 DIAGNOSIS — K7460 Unspecified cirrhosis of liver: Secondary | ICD-10-CM | POA: Diagnosis not present

## 2022-04-29 DIAGNOSIS — D123 Benign neoplasm of transverse colon: Secondary | ICD-10-CM | POA: Diagnosis not present

## 2022-05-06 DIAGNOSIS — K2930 Chronic superficial gastritis without bleeding: Secondary | ICD-10-CM | POA: Diagnosis not present

## 2022-05-06 DIAGNOSIS — B9681 Helicobacter pylori [H. pylori] as the cause of diseases classified elsewhere: Secondary | ICD-10-CM | POA: Diagnosis not present

## 2022-05-06 DIAGNOSIS — D123 Benign neoplasm of transverse colon: Secondary | ICD-10-CM | POA: Diagnosis not present

## 2022-09-02 DIAGNOSIS — Z6835 Body mass index (BMI) 35.0-35.9, adult: Secondary | ICD-10-CM | POA: Diagnosis not present

## 2022-09-02 DIAGNOSIS — I10 Essential (primary) hypertension: Secondary | ICD-10-CM | POA: Diagnosis not present

## 2022-09-02 DIAGNOSIS — G4700 Insomnia, unspecified: Secondary | ICD-10-CM | POA: Diagnosis not present

## 2022-09-02 DIAGNOSIS — Z87891 Personal history of nicotine dependence: Secondary | ICD-10-CM | POA: Diagnosis not present

## 2022-09-02 DIAGNOSIS — G8929 Other chronic pain: Secondary | ICD-10-CM | POA: Diagnosis not present

## 2022-09-02 DIAGNOSIS — F419 Anxiety disorder, unspecified: Secondary | ICD-10-CM | POA: Diagnosis not present

## 2022-09-02 DIAGNOSIS — K219 Gastro-esophageal reflux disease without esophagitis: Secondary | ICD-10-CM | POA: Diagnosis not present

## 2022-09-02 DIAGNOSIS — E785 Hyperlipidemia, unspecified: Secondary | ICD-10-CM | POA: Diagnosis not present

## 2022-09-02 DIAGNOSIS — J4489 Other specified chronic obstructive pulmonary disease: Secondary | ICD-10-CM | POA: Diagnosis not present

## 2022-09-02 NOTE — Progress Notes (Signed)
[facility] Follow-up:    Date:  09/03/2022   ID:  Shane Cabrera, DOB Aug 14, 1950, MRN [id number]  PCP:  Ileana Ladd, MD (Inactive)  Cardiologist:  None  Nephrologist:  Referring MD: No ref. provider found   CC: Hypertension  History of Present Illness:    Shane Cabrera is a 72 y.o. male with a hx of hypertension, hyperlipidemia, prediabetes, GERD, allergic rhinitis, ED, BPH, hepatic fibrosis, depression, and anxiety, here for follow-up. He was initially seen 09/11/2021 by Gillian Shields, NP in the [facility]. He had been struggling with labile hypertension for more than 10 years. At home his BP was averaging 160s-170s/60s-70s. He was following a low sodium diet. Lisinopril-HCTZ was discontinued. Started on Valsartan 160 mg daily and HCTZ 25 mg daily. Continued on diltiazem 480 mg QD. He was enrolled in the [facility] RPM study. He was also referred to [facility]. Also, he had complained of chest pain when lying down after meals or while walking. A cardiac CTA was ordered and showed a coronary calcium score of 1 with minimal nonobstructive coronary disease. Rosuvastatin was increased to 40 mg daily, and he was started on 81 mg ASA daily.   Labs from 09/19/21 revealed normal kidney function, electrolytes, and thyroid. His home blood pressure readings were unchanged, so valsartan was increased to 320 mg daily. On 10/14/2021 blood pressures per [facility] RPM were still not at goal. Spironolactone 12.5 mg daily was added, but he also expressed concerns about medication interactions with sildenafil which was then increased to 50 mg.  He followed up with our pharmacist 01/26/2022 and his blood pressure was 150/68 on valsartan, HCTZ, diltiazem, and spironolactone. In [facility] his BP averaged 136/72 at home for the prior month. Spironolactone was increased to 25 mg as he had difficulty cutting tablets in half. Planned to consider cutting back his diltiazem and transition to  amlodipine at his next visit.   At his visit 02/2022, blood pressure in the office was uncontrolled but readings in [facility] had been averaging 120s/60s-70s. Has known white coat hypertension superimposed on essential hypertension. He completed the [facility] study. He noted some fluctuation in blood pressures after eating salty meals. He was encouraged to continue working on his diet and exercise.   Today, he states he is feeling okay. In clinic his blood pressure is initially 150/71, and 152/60 on recheck. At home his readings are usually in the 130s-140s, and sometimes as high as the 160s. At home he uses two cuffs, and will notice that his blood pressure improves from 140s to 120s after checking again. His readings closer to the 140s are usually taken right after he sits down. He denies feeling overly stressed. He believes his diet may be contributory to his higher blood pressures. At times he may drink up to 2 beers for several consecutive days. He stopped exercising for a time while his wife fell ill. Her health is improving and he plans to start walking again. If he picks up something too heavy he will experience back pain for the next few days. He denies any anginal symptoms or shortness of breath with other activities. Additionally he reports digestive issues. Tomorrow he is scheduled to follow up with GI. He denies any palpitations, chest pain, shortness of breath, or peripheral edema. No lightheadedness, headaches, syncope, orthopnea, or PND.  Previous antihypertensives: Lisinopril-HCTZ  Past Medical History:  Diagnosis Date   CAD in native artery 03/05/2022   Hyperlipidemia    Hypertension    Murmur  09/03/2022   Prediabetes    White coat syndrome with diagnosis of hypertension 01/26/2022    Past Surgical History:  Procedure Laterality Date   COLONOSCOPY  2016   NASAL SEPTOPLASTY W/ TURBINOPLASTY Bilateral 07/09/2020   Procedure: NASAL SEPTOPLASTY WITH BILATERAL  TURBINATE REDUCTION;   Surgeon: Newman Pies, MD;  Location: [facility];  Service: ENT;  Laterality: Bilateral;    Current Medications: Current Meds  Medication Sig   albuterol (VENTOLIN HFA) 108 (90 Base) MCG/ACT inhaler Inhale 2 puffs into the lungs as needed for wheezing or shortness of breath.   budesonide-formoterol (SYMBICORT) 80-4.5 MCG/ACT inhaler Inhale 2 puffs into the lungs as needed.   diltiazem (DILACOR XR) 240 MG 24 hr capsule Take 480 mg by mouth daily.   fenofibrate 54 MG tablet Take 54 mg by mouth daily.   fluticasone (FLONASE) 50 MCG/ACT nasal spray Place into both nostrils daily.   hydrochlorothiazide (HYDRODIURIL) 25 MG tablet TAKE 1 TABLET BY MOUTH DAILY   loratadine (CLARITIN) 10 MG tablet Take 10 mg by mouth daily.   omeprazole (PRILOSEC) 20 MG capsule Take 20 mg by mouth daily.   PARoxetine (PAXIL) 10 MG tablet Take 10 mg by mouth daily.   rosuvastatin (CRESTOR) 40 MG tablet Take 1 tablet (40 mg total) by mouth daily.   sildenafil (VIAGRA) 50 MG tablet Take 1 tablet (50 mg total) by mouth daily as needed for erectile dysfunction.   spironolactone (ALDACTONE) 25 MG tablet TAKE 1/2 TABLET BY MOUTH DAILY   tadalafil (CIALIS) 20 MG tablet Take 20 mg by mouth daily as needed for erectile dysfunction.   valsartan (DIOVAN) 320 MG tablet Take 1 tablet (320 mg total) by mouth daily.   [DISCONTINUED] pantoprazole (PROTONIX) 40 MG tablet Take 40 mg by mouth daily.     Allergies:   Montelukast   Social History   Socioeconomic History   Marital status: Unknown    Spouse name: Not on file   Number of children: Not on file   Years of education: Not on file   Highest education level: Not on file  Occupational History   Not on file  Tobacco Use   Smoking status: Former   Smokeless tobacco: Never  Substance and Sexual Activity   Alcohol use: Yes    Comment: 3-4x per week   Drug use: Not Currently   Sexual activity: Not on file  Other Topics Concern   Not on file  Social History  Narrative   Not on file   Social Determinants of Health   Financial Resource Strain: Low Risk  (09/11/2021)   Overall Financial Resource Strain (CARDIA)    Difficulty of Paying Living Expenses: Not very hard  Food Insecurity: Food Insecurity Present (09/11/2021)   Hunger Vital Sign    Worried About Running Out of Food in the Last Year: Sometimes true    Ran Out of Food in the Last Year: Sometimes true  Transportation Needs: No Transportation Needs (09/11/2021)   PRAPARE - Administrator, Civil Service (Medical): No    Lack of Transportation (Non-Medical): No  Physical Activity: Insufficiently Active (09/11/2021)   Exercise Vital Sign    Days of Exercise per Week: 1 day    Minutes of Exercise per Session: 10 min  Stress: No Stress Concern Present (09/11/2021)   [employer] of Occupational Health - Occupational Stress Questionnaire    Feeling of Stress : Only a little  Social Connections: Socially Integrated (09/11/2021)   Social Connection and  Isolation Panel [NHANES]    Frequency of Communication with Friends and Family: Twice a week    Frequency of Social Gatherings with Friends and Family: Once a week    Attends Religious Services: More than 4 times per year    Active Member of [employer] or Organizations: Yes    Attends Engineer, structural: More than 4 times per year    Marital Status: Married     Family History: The patient's family history includes Heart attack in his brother, mother, and sister; Hypertension in his mother.  ROS:   Please see the history of present illness.    All other systems reviewed and are negative.  EKGs/Labs/Other Studies Reviewed:    Coronary CTA  10/20/2021: IMPRESSION: 1. Coronary calcium score of 1.0. This was 13th percentile for age-, sex, and race-matched controls.   2. Normal coronary origin with right dominance.   3. Minimal mixed density plaque (<25%) in the LAD.   4. Minimal non-calcified plaque (<25%) in the ramus.    5. Normal LCX/RCA.  US Aorta Medicare Screening  11/19/2017: IMPRESSION: 1. Mild ectasia of the abdominal aorta measuring 2.8 cm in maximal diameter. Recommend follow-up aortic ultrasound in 5 years. This recommendation follows ACR consensus guidelines: White Paper of the ACR Incidental Findings Committee II on Vascular Findings. J Am Coll Radiol 2013; 10:789-794. 2.  Aortic Atherosclerosis (ICD10-I70.0).   EKG:  EKG is personally reviewed. 09/03/2022:  Sinus bradycardia. Rate 57 bpm. First degree AV block. Cannot rule out prior septal infarct. 03/03/2022: EKG was not ordered. 09/11/2021 Gillian Shields, NP): NSR 83 bpm with no acute ST/T wave changes.     Recent Labs: 09/18/2021: TSH 1.510 12/24/2021: ALT 17 02/13/2022: BUN 17; Creatinine, Ser 1.08; Potassium 4.2; Sodium 141   Recent Lipid Panel    Component Value Date/Time   CHOL 130 12/24/2021 0821   TRIG 72 12/24/2021 0821   HDL 68 12/24/2021 0821   CHOLHDL 1.9 12/24/2021 0821   LDLCALC 47 12/24/2021 0821    Physical Exam:    VS:  BP (!) 152/60 (BP Location: Right Arm, Patient Position: Sitting, Cuff Size: Large)   Pulse (!) 57   Ht 5\' 1"  (1.549 m)   Wt 186 lb 1.6 oz (84.4 kg)   BMI 35.16 kg/m  , BMI Body mass index is 35.16 kg/m. GENERAL:  Well appearing HEENT: Pupils equal round and reactive, fundi not visualized, oral mucosa unremarkable NECK:  No jugular venous distention, waveform within normal limits, carotid upstroke brisk and symmetric, no bruits, no thyromegaly. LUNGS:  Clear to auscultation bilaterally HEART:  RRR.  PMI not displaced or sustained,S1 and S2 within normal limits, no S3, no S4, no clicks, no rubs, 2/6 systolic murmur. ABD:  Flat, positive bowel sounds normal in frequency in pitch, no bruits, no rebound, no guarding, no midline pulsatile mass, no hepatomegaly, no splenomegaly EXT:  2 plus pulses throughout, no edema, no cyanosis no clubbing SKIN:  No rashes no nodules NEURO:  Cranial nerves II  through XII grossly intact, motor grossly intact throughout PSYCH:  Cognitively intact, oriented to person place and time   ASSESSMENT/PLAN:    CAD in native artery Non-obstructive disease.  He had minimal plaque on coronary CTA 10/2021 which was 13 percentile.  Lipids are well-controlled.  He was encouraged to increase his exercise to least 150 minutes weekly.  Continue rosuvastatin and fenofibrate.  White coat syndrome with diagnosis of hypertension Blood pressure elevated in the office but has been in the  120s at home after he rests for 5 minutes.  He was asked to bring his blood pressure cuff to clinic next visit.  Will not make any changes to his diltiazem, HCTZ, spironolactone, or valsartan.  He was encouraged to increase his exercise to least 150 minutes weekly and to limit alcohol intake.  Murmur To the sick systolic murmur noted on exam.  No heart failure symptoms.  Will get a baseline echocardiogram to rule out mild aortic stenosis.   Screening for Secondary Hypertension:     09/11/2021   12:58 PM  Causes  Sleep Apnea Screened     - Comments Snores but wakes well rested. Consider sleep study at follow up.  Thyroid Disease Screened     - Comments TSH ordered  Coarctation of the Aorta Screened     - Comments BP symmetrical  Compliance Screened    Relevant Labs/Studies:    Latest Ref Rng & Units 02/13/2022    9:04 AM 12/24/2021    8:21 AM 10/10/2021    9:25 AM  Basic Labs  Sodium 134 - 144 mmol/L 141  139  142   Potassium 3.5 - 5.2 mmol/L 4.2  4.3  4.1   Creatinine 0.76 - 1.27 mg/dL 1.61  0.96  0.45        Latest Ref Rng & Units 09/18/2021    8:08 AM  Thyroid   TSH 0.450 - 4.500 uIU/mL 1.510     Disposition:    FU with APP in 6 months.  Medication Adjustments/Labs and Tests Ordered: Current medicines are reviewed at length with the patient today.  Concerns regarding medicines are outlined above.   Orders Placed This Encounter  Procedures   EKG 12-Lead    ECHOCARDIOGRAM COMPLETE   No orders of the defined types were placed in this encounter.  I,Mathew Stumpf,acting as a Neurosurgeon for Chilton Si, MD.,have documented all relevant documentation on the behalf of Chilton Si, MD,as directed by  Chilton Si, MD while in the presence of Chilton Si, MD.  I, Temesgen Weightman C. Duke Salvia, MD have reviewed all documentation for this visit.  The documentation of the exam, diagnosis, procedures, and orders on 09/03/2022 are all accurate and complete.  Signed, Chilton Si, MD  09/03/2022 9:39 AM    [facility]

## 2022-09-03 ENCOUNTER — Ambulatory Visit (HOSPITAL_BASED_OUTPATIENT_CLINIC_OR_DEPARTMENT_OTHER): Payer: Medicare HMO | Admitting: Cardiovascular Disease

## 2022-09-03 ENCOUNTER — Encounter (HOSPITAL_BASED_OUTPATIENT_CLINIC_OR_DEPARTMENT_OTHER): Payer: Self-pay | Admitting: Cardiovascular Disease

## 2022-09-03 VITALS — BP 152/60 | HR 57 | Ht 61.0 in | Wt 186.1 lb

## 2022-09-03 DIAGNOSIS — R011 Cardiac murmur, unspecified: Secondary | ICD-10-CM | POA: Diagnosis not present

## 2022-09-03 DIAGNOSIS — I2510 Atherosclerotic heart disease of native coronary artery without angina pectoris: Secondary | ICD-10-CM | POA: Diagnosis not present

## 2022-09-03 DIAGNOSIS — I10 Essential (primary) hypertension: Secondary | ICD-10-CM

## 2022-09-03 HISTORY — DX: Cardiac murmur, unspecified: R01.1

## 2022-09-03 NOTE — Patient Instructions (Signed)
Medication Instructions:  Your physician recommends that you continue on your current medications as directed. Please refer to the Current Medication list given to you today.  *If you need a refill on your cardiac medications before your next appointment, please call your pharmacy*   Testing/Procedures: Your physician has requested that you have an echocardiogram. Echocardiography is a painless test that uses sound waves to create images of your heart. It provides your doctor with information about the size and shape of your heart and how well your heart's chambers and valves are working. This procedure takes approximately one hour. There are no restrictions for this procedure. Please do NOT wear cologne, perfume, aftershave, or lotions (deodorant is allowed). Please arrive 15 minutes prior to your appointment time.   Follow-Up: At [facility], you and your health needs are our priority.  As part of our continuing mission to provide you with exceptional heart care, we have created designated Provider Care Teams.  These Care Teams include your primary Cardiologist (physician) and Advanced Practice Providers (APPs -  Physician Assistants and Nurse Practitioners) who all work together to provide you with the care you need, when you need it.  We recommend signing up for the patient portal called "MyChart".  Sign up information is provided on this After Visit Summary.  MyChart is used to connect with patients for Virtual Visits (Telemedicine).  Patients are able to view lab/test results, encounter notes, upcoming appointments, etc.  Non-urgent messages can be sent to your provider as well.   To learn more about what you can do with MyChart, go to [email or web address].    Your next appointment:   6 month(s) ADV HTN  Provider:   Chilton Si, MD r Gillian Shields, NP   Other Instructions Dr. Duke Salvia has recommended increasing your exercise to 150 minutes per week.  Please bring  your blood pressure machine to your next visit.

## 2022-09-03 NOTE — Assessment & Plan Note (Signed)
Blood pressure elevated in the office but has been in the 120s at home after he rests for 5 minutes.  He was asked to bring his blood pressure cuff to clinic next visit.  Will not make any changes to his diltiazem, HCTZ, spironolactone, or valsartan.  He was encouraged to increase his exercise to least 150 minutes weekly and to limit alcohol intake.

## 2022-09-03 NOTE — Assessment & Plan Note (Signed)
To the sick systolic murmur noted on exam.  No heart failure symptoms.  Will get a baseline echocardiogram to rule out mild aortic stenosis.

## 2022-09-03 NOTE — Assessment & Plan Note (Signed)
Non-obstructive disease.  He had minimal plaque on coronary CTA 10/2021 which was 13 percentile.  Lipids are well-controlled.  He was encouraged to increase his exercise to least 150 minutes weekly.  Continue rosuvastatin and fenofibrate.

## 2022-09-16 ENCOUNTER — Other Ambulatory Visit (HOSPITAL_BASED_OUTPATIENT_CLINIC_OR_DEPARTMENT_OTHER): Payer: Self-pay | Admitting: Family

## 2022-09-16 DIAGNOSIS — I10 Essential (primary) hypertension: Secondary | ICD-10-CM

## 2022-09-16 NOTE — Telephone Encounter (Signed)
Rx request sent to pharmacy.  

## 2022-10-02 ENCOUNTER — Other Ambulatory Visit (HOSPITAL_BASED_OUTPATIENT_CLINIC_OR_DEPARTMENT_OTHER): Payer: Medicare HMO

## 2022-10-07 ENCOUNTER — Telehealth (HOSPITAL_BASED_OUTPATIENT_CLINIC_OR_DEPARTMENT_OTHER): Payer: Self-pay | Admitting: Cardiovascular Disease

## 2022-10-07 NOTE — Telephone Encounter (Signed)
Left message for patient to call and discuss rescheduling the Echocardiogram ordered by Dr. [place] 

## 2022-10-13 ENCOUNTER — Other Ambulatory Visit (HOSPITAL_BASED_OUTPATIENT_CLINIC_OR_DEPARTMENT_OTHER): Payer: Self-pay | Admitting: Family

## 2022-10-13 NOTE — Telephone Encounter (Signed)
Rx(s) sent to pharmacy electronically.  

## 2022-10-13 NOTE — Telephone Encounter (Signed)
Left message for patient to call and reschedule the Echocardiogram ordered by Dr. Duke Salvia

## 2022-11-05 ENCOUNTER — Other Ambulatory Visit: Payer: Self-pay | Admitting: Gastroenterology

## 2022-11-05 DIAGNOSIS — A048 Other specified bacterial intestinal infections: Secondary | ICD-10-CM | POA: Diagnosis not present

## 2022-11-05 DIAGNOSIS — Z86010 Personal history of colonic polyps: Secondary | ICD-10-CM | POA: Diagnosis not present

## 2022-11-05 DIAGNOSIS — K31A0 Gastric intestinal metaplasia, unspecified: Secondary | ICD-10-CM | POA: Diagnosis not present

## 2022-11-05 DIAGNOSIS — K7400 Hepatic fibrosis, unspecified: Secondary | ICD-10-CM

## 2022-11-11 ENCOUNTER — Ambulatory Visit (INDEPENDENT_AMBULATORY_CARE_PROVIDER_SITE_OTHER): Payer: Medicare HMO

## 2022-11-11 DIAGNOSIS — I340 Nonrheumatic mitral (valve) insufficiency: Secondary | ICD-10-CM

## 2022-11-11 DIAGNOSIS — R011 Cardiac murmur, unspecified: Secondary | ICD-10-CM

## 2022-11-11 DIAGNOSIS — I5030 Unspecified diastolic (congestive) heart failure: Secondary | ICD-10-CM

## 2022-11-11 DIAGNOSIS — I2510 Atherosclerotic heart disease of native coronary artery without angina pectoris: Secondary | ICD-10-CM | POA: Diagnosis not present

## 2022-11-11 LAB — ECHOCARDIOGRAM COMPLETE
Area-P 1/2: 3.99 cm2
S' Lateral: 2.75 cm

## 2022-11-17 ENCOUNTER — Telehealth (HOSPITAL_BASED_OUTPATIENT_CLINIC_OR_DEPARTMENT_OTHER): Payer: Self-pay | Admitting: Cardiovascular Disease

## 2022-11-17 NOTE — Telephone Encounter (Signed)
Received fax from [facility], they only have a PT/INR from 8/1. Will speak with lab tech on site before proceeding further

## 2022-11-17 NOTE — Telephone Encounter (Signed)
Consulted with lab tech on site, patient is not listed as having come to get labs on 8/7.   Returned call to patient, no answer, left message for patient to come have labs drawn

## 2022-11-17 NOTE — Telephone Encounter (Signed)
Follow Up:    Patient says he would like his lab results from 11-11-22 please.

## 2022-11-17 NOTE — Telephone Encounter (Signed)
Returned call to patient,   Advised him that I see that he had an echo done on 8/7 but I do not see any lab work from that day.   Called [facility], they have results from 8/1, being faxed over.   Will await fax and plan accordingly.

## 2022-11-18 ENCOUNTER — Other Ambulatory Visit: Payer: Medicare HMO

## 2022-11-23 ENCOUNTER — Other Ambulatory Visit: Payer: Medicare HMO

## 2022-11-25 ENCOUNTER — Ambulatory Visit
Admission: RE | Admit: 2022-11-25 | Discharge: 2022-11-25 | Disposition: A | Discharge status: Home / Self Care | Payer: Medicare HMO | Source: Ambulatory Visit | Attending: Gastroenterology | Admitting: Gastroenterology

## 2022-11-25 DIAGNOSIS — K7400 Hepatic fibrosis, unspecified: Secondary | ICD-10-CM

## 2022-11-26 DIAGNOSIS — A048 Other specified bacterial intestinal infections: Secondary | ICD-10-CM | POA: Diagnosis not present

## 2022-12-09 DIAGNOSIS — E669 Obesity, unspecified: Secondary | ICD-10-CM | POA: Diagnosis not present

## 2022-12-09 DIAGNOSIS — Z0000 Encounter for general adult medical examination without abnormal findings: Secondary | ICD-10-CM | POA: Diagnosis not present

## 2022-12-09 DIAGNOSIS — Z1389 Encounter for screening for other disorder: Secondary | ICD-10-CM | POA: Diagnosis not present

## 2022-12-17 DIAGNOSIS — L820 Inflamed seborrheic keratosis: Secondary | ICD-10-CM | POA: Diagnosis not present

## 2022-12-17 DIAGNOSIS — L821 Other seborrheic keratosis: Secondary | ICD-10-CM | POA: Diagnosis not present

## 2023-01-19 DIAGNOSIS — Z6835 Body mass index (BMI) 35.0-35.9, adult: Secondary | ICD-10-CM | POA: Diagnosis not present

## 2023-01-19 DIAGNOSIS — E7800 Pure hypercholesterolemia, unspecified: Secondary | ICD-10-CM | POA: Diagnosis not present

## 2023-01-19 DIAGNOSIS — K7400 Hepatic fibrosis, unspecified: Secondary | ICD-10-CM | POA: Diagnosis not present

## 2023-01-19 DIAGNOSIS — I700 Atherosclerosis of aorta: Secondary | ICD-10-CM | POA: Diagnosis not present

## 2023-01-19 DIAGNOSIS — R7303 Prediabetes: Secondary | ICD-10-CM | POA: Diagnosis not present

## 2023-02-17 ENCOUNTER — Encounter (HOSPITAL_BASED_OUTPATIENT_CLINIC_OR_DEPARTMENT_OTHER): Payer: Medicare HMO | Admitting: Cardiovascular Disease

## 2023-02-17 NOTE — Progress Notes (Deleted)
[facility]pert[facility] Follow-up:    Date:  02/17/2023   ID:  Shane Cabrera, DOB 25-Jun-1950, MRN [id number]  PCP:  Ileana Ladd, MD (Inactive)  Cardiologist:  None  Nephrologist:  Referring MD: No ref. provider found   CC: Hypertension  History of Present Illness:    Shane Cabrera is a 72 y.o. male with a hx of hypertension, hyperlipidemia, prediabetes, GERD, allergic rhinitis, ED, BPH, hepatic fibrosis, depression, and anxiety, here for follow-up. He was initially seen 09/11/2021 by Gillian Shields, NP in the [facility]. He had been struggling with labile hypertension for more than 10 years. At home his BP was averaging 160s-170s/60s-70s. He was following a low sodium diet. Lisinopril-HCTZ was discontinued. Started on Valsartan 160 mg daily and HCTZ 25 mg daily. Continued on diltiazem 480 mg QD. He was enrolled in the [facility] RPM study. He was also referred to [facility]. Also, he had complained of chest pain when lying down after meals or while walking. A cardiac CTA was ordered and showed a coronary calcium score of 1 with minimal nonobstructive coronary disease. Rosuvastatin was increased to 40 mg daily, and he was started on 81 mg ASA daily.   Labs from 09/19/21 revealed normal kidney function, electrolytes, and thyroid. His home blood pressure readings were unchanged, so valsartan was increased to 320 mg daily. On 10/14/2021 blood pressures per [facility] RPM were still not at goal. Spironolactone 12.5 mg daily was added, but he also expressed concerns about medication interactions with sildenafil which was then increased to 50 mg.  He followed up with our pharmacist 01/26/2022 and his blood pressure was 150/68 on valsartan, HCTZ, diltiazem, and spironolactone. In [facility] his BP averaged 136/72 at home for the prior month. Spironolactone was increased to 25 mg as he had difficulty cutting tablets in half. Planned to consider cutting back his diltiazem and transition to  amlodipine at his next visit.   At his visit 02/2022, blood pressure in the office was uncontrolled but readings in [facility] had been averaging 120s/60s-70s. Has known white coat hypertension superimposed on essential hypertension. He completed the [facility] study. He noted some fluctuation in blood pressures after eating salty meals. He was encouraged to continue working on his diet and exercise.   At his visit 08/2022 blood pressure was elevated in the office to the 150s but better controlled at home.  He was noted to have a systolic murmur on exam.  Echo 11/2022 revealed LVEF 50-55% with grade 1 diastolic dysfunction.  There were no significant valvular abnormalities.  Check home cuff   Previous antihypertensives: Lisinopril-HCTZ  Past Medical History:  Diagnosis Date   CAD in native artery 03/05/2022   Hyperlipidemia    Hypertension    Murmur 09/03/2022   Prediabetes    White coat syndrome with diagnosis of hypertension 01/26/2022    Past Surgical History:  Procedure Laterality Date   COLONOSCOPY  2016   NASAL SEPTOPLASTY W/ TURBINOPLASTY Bilateral 07/09/2020   Procedure: NASAL SEPTOPLASTY WITH BILATERAL  TURBINATE REDUCTION;  Surgeon: Newman Pies, MD;  Location: [facility];  Service: ENT;  Laterality: Bilateral;    Current Medications: No outpatient medications have been marked as taking for the 02/17/23 encounter (Appointment) with Chilton Si, MD.     Allergies:   Montelukast   Social History   Socioeconomic History   Marital status: Unknown    Spouse name: Not on file   Number of children: Not on file   Years of education: Not on  file   Highest education level: Not on file  Occupational History   Not on file  Tobacco Use   Smoking status: Former   Smokeless tobacco: Never  Substance and Sexual Activity   Alcohol use: Yes    Comment: 3-4x per week   Drug use: Not Currently   Sexual activity: Not on file  Other Topics Concern   Not on file  Social  History Narrative   Not on file   Social Determinants of Health   Financial Resource Strain: Low Risk  (09/11/2021)   Overall Financial Resource Strain (CARDIA)    Difficulty of Paying Living Expenses: Not very hard  Food Insecurity: Food Insecurity Present (09/11/2021)   Hunger Vital Sign    Worried About Running Out of Food in the Last Year: Sometimes true    Ran Out of Food in the Last Year: Sometimes true  Transportation Needs: No Transportation Needs (09/11/2021)   PRAPARE - Administrator, Civil Service (Medical): No    Lack of Transportation (Non-Medical): No  Physical Activity: Insufficiently Active (09/11/2021)   Exercise Vital Sign    Days of Exercise per Week: 1 day    Minutes of Exercise per Session: 10 min  Stress: No Stress Concern Present (09/11/2021)   [employer] of Occupational Health - Occupational Stress Questionnaire    Feeling of Stress : Only a little  Social Connections: Socially Integrated (09/11/2021)   Social Connection and Isolation Panel [NHANES]    Frequency of Communication with Friends and Family: Twice a week    Frequency of Social Gatherings with Friends and Family: Once a week    Attends Religious Services: More than 4 times per year    Active Member of [employer] or Organizations: Yes    Attends Engineer, structural: More than 4 times per year    Marital Status: Married     Family History: The patient's family history includes Heart attack in his brother, mother, and sister; Hypertension in his mother.  ROS:   Please see the history of present illness.    All other systems reviewed and are negative.  EKGs/Labs/Other Studies Reviewed:    Echo 11/2022:  1. Left ventricular ejection fraction, by estimation, is 50 to 55%. The  left ventricle has low normal function. The left ventricle has no regional  wall motion abnormalities. Left ventricular diastolic parameters are  consistent with Grade I diastolic  dysfunction (impaired  relaxation).   2. Right ventricular systolic function is normal. The right ventricular  size is normal.   3. The mitral valve is normal in structure. Mild mitral valve  regurgitation. No evidence of mitral stenosis.   4. The aortic valve is normal in structure. Aortic valve regurgitation is  not visualized. No aortic stenosis is present.   5. The inferior vena cava is normal in size with greater than 50%  respiratory variability, suggesting right atrial pressure of 3 mmHg.   Coronary CTA  10/20/2021: IMPRESSION: 1. Coronary calcium score of 1.0. This was 13th percentile for age-, sex, and race-matched controls.   2. Normal coronary origin with right dominance.   3. Minimal mixed density plaque (<25%) in the LAD.   4. Minimal non-calcified plaque (<25%) in the ramus.   5. Normal LCX/RCA.  US Aorta Medicare Screening  11/19/2017: IMPRESSION: 1. Mild ectasia of the abdominal aorta measuring 2.8 cm in maximal diameter. Recommend follow-up aortic ultrasound in 5 years. This recommendation follows ACR consensus guidelines: White Paper of the  ACR Incidental Findings Committee II on Vascular Findings. J Am Coll Radiol 2013; 10:789-794. 2.  Aortic Atherosclerosis (ICD10-I70.0).   EKG:  EKG is personally reviewed. 09/03/2022:  Sinus bradycardia. Rate 57 bpm. First degree AV block. Cannot rule out prior septal infarct. 03/03/2022: EKG was not ordered. 09/11/2021 Gillian Shields, NP): NSR 83 bpm with no acute ST/T wave changes.     Recent Labs: No results found for requested labs within last 365 days.   Recent Lipid Panel    Component Value Date/Time   CHOL 130 12/24/2021 0821   TRIG 72 12/24/2021 0821   HDL 68 12/24/2021 0821   CHOLHDL 1.9 12/24/2021 0821   LDLCALC 47 12/24/2021 0821    Physical Exam:    VS:  There were no vitals taken for this visit. , BMI There is no height or weight on file to calculate BMI. GENERAL:  Well appearing HEENT: Pupils equal round and reactive,  fundi not visualized, oral mucosa unremarkable NECK:  No jugular venous distention, waveform within normal limits, carotid upstroke brisk and symmetric, no bruits, no thyromegaly. LUNGS:  Clear to auscultation bilaterally HEART:  RRR.  PMI not displaced or sustained,S1 and S2 within normal limits, no S3, no S4, no clicks, no rubs, 2/6 systolic murmur. ABD:  Flat, positive bowel sounds normal in frequency in pitch, no bruits, no rebound, no guarding, no midline pulsatile mass, no hepatomegaly, no splenomegaly EXT:  2 plus pulses throughout, no edema, no cyanosis no clubbing SKIN:  No rashes no nodules NEURO:  Cranial nerves II through XII grossly intact, motor grossly intact throughout PSYCH:  Cognitively intact, oriented to person place and time   ASSESSMENT/PLAN:    No problem-specific Assessment & Plan notes found for this encounter.    Screening for Secondary Hypertension:     09/11/2021   12:58 PM  Causes  Sleep Apnea Screened     - Comments Snores but wakes well rested. Consider sleep study at follow up.  Thyroid Disease Screened     - Comments TSH ordered  Coarctation of the Aorta Screened     - Comments BP symmetrical  Compliance Screened    Relevant Labs/Studies:    Latest Ref Rng & Units 02/13/2022    9:04 AM 12/24/2021    8:21 AM 10/10/2021    9:25 AM  Basic Labs  Sodium 134 - 144 mmol/L 141  139  142   Potassium 3.5 - 5.2 mmol/L 4.2  4.3  4.1   Creatinine 0.76 - 1.27 mg/dL 3.66  4.40  3.47        Latest Ref Rng & Units 09/18/2021    8:08 AM  Thyroid   TSH 0.450 - 4.500 uIU/mL 1.510     Disposition:    FU with APP in 6 months.  Medication Adjustments/Labs and Tests Ordered: Current medicines are reviewed at length with the patient today.  Concerns regarding medicines are outlined above.   No orders of the defined types were placed in this encounter.  No orders of the defined types were placed in this encounter.   Signed, Chilton Si, MD   02/17/2023 7:44 AM    [facility]

## 2023-02-24 ENCOUNTER — Other Ambulatory Visit (HOSPITAL_BASED_OUTPATIENT_CLINIC_OR_DEPARTMENT_OTHER): Payer: Self-pay | Admitting: Cardiovascular Disease

## 2023-04-13 DIAGNOSIS — Z008 Encounter for other general examination: Secondary | ICD-10-CM | POA: Diagnosis not present

## 2023-05-07 ENCOUNTER — Ambulatory Visit (INDEPENDENT_AMBULATORY_CARE_PROVIDER_SITE_OTHER): Payer: Medicare HMO | Admitting: Cardiovascular Disease

## 2023-05-07 ENCOUNTER — Encounter (HOSPITAL_BASED_OUTPATIENT_CLINIC_OR_DEPARTMENT_OTHER): Payer: Self-pay | Admitting: Cardiovascular Disease

## 2023-05-07 VITALS — BP 162/68 | HR 80 | Ht 61.0 in | Wt 192.8 lb

## 2023-05-07 DIAGNOSIS — Z6836 Body mass index (BMI) 36.0-36.9, adult: Secondary | ICD-10-CM

## 2023-05-07 DIAGNOSIS — I10 Essential (primary) hypertension: Secondary | ICD-10-CM | POA: Diagnosis not present

## 2023-05-07 DIAGNOSIS — I2510 Atherosclerotic heart disease of native coronary artery without angina pectoris: Secondary | ICD-10-CM | POA: Diagnosis not present

## 2023-05-07 NOTE — Patient Instructions (Signed)
Medication Instructions:  Your physician recommends that you continue on your current medications as directed. Please refer to the Current Medication list given to you today.  Labwork: NONE  Testing/Procedures: NONE  Follow-Up: 3 TO 4 MONTHS  You have been referred to [facility] IF YOU DO NOT HEAR FROM THEM IN 2 WEEKS YOU CAN CALL THEM DIRECTLY AT THE HIGHLIGHTED NUMBER   Any Other Special Instructions Will Be Listed Below (If Applicable). TRY TO EXERCISE AT LEAST 5 MINUTES EACH DAY   If you need a refill on your cardiac medications before your next appointment, please call your pharmacy.

## 2023-05-07 NOTE — Progress Notes (Signed)
[facility]pert[facility] Follow-up:    Date:  05/07/2023   ID:  Shane Cabrera, DOB 1950/06/05, MRN [id number]  PCP:  Ileana Ladd, MD (Inactive)  Cardiologist:  None   Referring MD: No ref. provider found   CC: Hypertension  History of Present Illness:    Shane Cabrera is a 73 y.o. male with a hx of hypertension, hyperlipidemia, prediabetes, GERD, allergic rhinitis, ED, BPH, hepatic fibrosis, depression, and anxiety, here for follow-up. He was initially seen 09/11/2021 by Gillian Shields, NP in the [facility]. He had been struggling with labile hypertension for more than 10 years. At home his BP was averaging 160s-170s/60s-70s. He was following a low sodium diet. Lisinopril-HCTZ was discontinued. Started on Valsartan 160 mg daily and HCTZ 25 mg daily. Continued on diltiazem 480 mg QD. He was enrolled in the [facility] RPM study. He was also referred to [facility]. Also, he had complained of chest pain when lying down after meals or while walking. A cardiac CTA was ordered and showed a coronary calcium score of 1 with minimal nonobstructive coronary disease. Rosuvastatin was increased to 40 mg daily, and he was started on 81 mg ASA daily.   Labs from 09/19/21 revealed normal kidney function, electrolytes, and thyroid. His home blood pressure readings were unchanged, so valsartan was increased to 320 mg daily. On 10/14/2021 blood pressures per [facility] RPM were still not at goal. Spironolactone 12.5 mg daily was added, but he also expressed concerns about medication interactions with sildenafil which was then increased to 50 mg.  He followed up with our pharmacist 01/26/2022 and his blood pressure was 150/68 on valsartan, HCTZ, diltiazem, and spironolactone. In [facility] his BP averaged 136/72 at home for the prior month. Spironolactone was increased to 25 mg as he had difficulty cutting tablets in half. Planned to consider cutting back his diltiazem and transition to amlodipine at his  next visit.   At his visit 02/2022, blood pressure in the office was uncontrolled but readings in [facility] had been averaging 120s/60s-70s. 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 He completed the [facility] study. He noted some fluctuation in blood pressures after eating salty meals. He was encouraged to continue working on his diet and exercise.   At his visit 08/2022 he was feeling well.  Home blood pressure readings were in the 130s to 140s initially but improved to 120 after resting.  He was noted to have systolic murmur on exam.  Echocardiogram revealed LVEF 50-55% with grade 1 diastolic dysfunction.  There were no valvular abnormalities.  Shane Cabrera presents with concerns about exercise limitations due to a potential heart condition. His concerns about heart health primarily revolve around exercise limitations. A previous CT scan from July 23 revealed minimal arterial blockage of less than 25%, and an echocardiogram showed a normal ejection fraction of 55%. No chest pain or pressure occurs during exercise, but there is occasional discomfort when lying down after climbing stairs, suspected to be acid reflux.  Exercise limitations are a concern, particularly with activities like walking stairs and using a treadmill. Despite no chest pain or pressure during exercise, he notes a lack of energy and difficulty maintaining a consistent routine. He is trying to manage weight but struggles with poor eating habits and portion control, cooking at home and reducing carbohydrate intake.  Blood pressure monitoring at home shows variability, with readings around 137/60 to 140. Beer consumption affects blood pressure, typically drinking two to three beers, with noted improvement upon  reduction. Current medications include diltiazem, hydrochlorothiazide, spironolactone, and valsartan for blood pressure, and rosuvastatin and fenofibrate for cholesterol management.   Marland Kitchen He is  considering consulting a nutritionist for guidance.  He struggles with weight gain.     Previous antihypertensives: Lisinopril-HCTZ  Past Medical History:  Diagnosis Date   CAD in native artery 03/05/2022   Hyperlipidemia    Hypertension    Murmur 09/03/2022   Prediabetes    White coat syndrome with diagnosis of hypertension 01/26/2022    Past Surgical History:  Procedure Laterality Date   COLONOSCOPY  2016   NASAL SEPTOPLASTY W/ TURBINOPLASTY Bilateral 07/09/2020   Procedure: NASAL SEPTOPLASTY WITH BILATERAL  TURBINATE REDUCTION;  Surgeon: Newman Pies, MD;  Location: [facility];  Service: ENT;  Laterality: Bilateral;    Current Medications: Current Meds  Medication Sig   albuterol (VENTOLIN HFA) 108 (90 Base) MCG/ACT inhaler Inhale 2 puffs into the lungs as needed for wheezing or shortness of breath.   budesonide-formoterol (SYMBICORT) 80-4.5 MCG/ACT inhaler Inhale 2 puffs into the lungs as needed.   diltiazem (DILACOR XR) 240 MG 24 hr capsule Take 480 mg by mouth daily.   fenofibrate 54 MG tablet Take 54 mg by mouth daily.   fluticasone (FLONASE) 50 MCG/ACT nasal spray Place into both nostrils daily.   hydrochlorothiazide (HYDRODIURIL) 25 MG tablet TAKE 1 TABLET BY MOUTH DAILY   loratadine (CLARITIN) 10 MG tablet Take 10 mg by mouth daily.   omeprazole (PRILOSEC) 20 MG capsule Take 20 mg by mouth daily.   PARoxetine (PAXIL) 10 MG tablet Take 10 mg by mouth daily.   rosuvastatin (CRESTOR) 40 MG tablet TAKE 1 TABLET BY MOUTH DAILY   sildenafil (VIAGRA) 50 MG tablet Take 1 tablet (50 mg total) by mouth daily as needed for erectile dysfunction.   spironolactone (ALDACTONE) 25 MG tablet TAKE 1 TABLET BY MOUTH DAILY   tadalafil (CIALIS) 20 MG tablet Take 20 mg by mouth daily as needed for erectile dysfunction.   valsartan (DIOVAN) 320 MG tablet TAKE 1 TABLET BY MOUTH DAILY     Allergies:   Montelukast   Social History   Socioeconomic History   Marital status:  Unknown    Spouse name: Not on file   Number of children: Not on file   Years of education: Not on file   Highest education level: Not on file  Occupational History   Not on file  Tobacco Use   Smoking status: Former   Smokeless tobacco: Never  Substance and Sexual Activity   Alcohol use: Yes    Comment: 3-4x per week   Drug use: Not Currently   Sexual activity: Not on file  Other Topics Concern   Not on file  Social History Narrative   Not on file   Social Drivers of Health   Financial Resource Strain: Low Risk  (09/11/2021)   Overall Financial Resource Strain (CARDIA)    Difficulty of Paying Living Expenses: Not very hard  Food Insecurity: Food Insecurity Present (09/11/2021)   Hunger Vital Sign    Worried About Running Out of Food in the Last Year: Sometimes true    Ran Out of Food in the Last Year: Sometimes true  Transportation Needs: No Transportation Needs (09/11/2021)   PRAPARE - Administrator, Civil Service (Medical): No    Lack of Transportation (Non-Medical): No  Physical Activity: Insufficiently Active (09/11/2021)   Exercise Vital Sign    Days of Exercise per Week: 1 day  Minutes of Exercise per Session: 10 min  Stress: No Stress Concern Present (09/11/2021)   [employer] of Occupational Health - Occupational Stress Questionnaire    Feeling of Stress : Only a little  Social Connections: Socially Integrated (09/11/2021)   Social Connection and Isolation Panel [NHANES]    Frequency of Communication with Friends and Family: Twice a week    Frequency of Social Gatherings with Friends and Family: Once a week    Attends Religious Services: More than 4 times per year    Active Member of [employer] or Organizations: Yes    Attends Engineer, structural: More than 4 times per year    Marital Status: Married     Family History: The patient's family history includes Heart attack in his brother, mother, and sister; Hypertension in his mother.  ROS:    Please see the history of present illness.    All other systems reviewed and are negative.  EKGs/Labs/Other Studies Reviewed:    Echo 11/2022: IMPRESSIONS     1. Left ventricular ejection fraction, by estimation, is 50 to 55%. The  left ventricle has low normal function. The left ventricle has no regional  wall motion abnormalities. Left ventricular diastolic parameters are  consistent with Grade I diastolic  dysfunction (impaired relaxation).   2. Right ventricular systolic function is normal. The right ventricular  size is normal.   3. The mitral valve is normal in structure. Mild mitral valve  regurgitation. No evidence of mitral stenosis.   4. The aortic valve is normal in structure. Aortic valve regurgitation is  not visualized. No aortic stenosis is present.   5. The inferior vena cava is normal in size with greater than 50%  respiratory variability, suggesting right atrial pressure of 3 mmHg.   Coronary CTA  10/20/2021: IMPRESSION: 1. Coronary calcium score of 1.0. This was 13th percentile for age-, sex, and race-matched controls.   2. Normal coronary origin with right dominance.   3. Minimal mixed density plaque (<25%) in the LAD.   4. Minimal non-calcified plaque (<25%) in the ramus.   5. Normal LCX/RCA.  US Aorta Medicare Screening  11/19/2017: IMPRESSION: 1. Mild ectasia of the abdominal aorta measuring 2.8 cm in maximal diameter. Recommend follow-up aortic ultrasound in 5 years. This recommendation follows ACR consensus guidelines: White Paper of the ACR Incidental Findings Committee II on Vascular Findings. J Am Coll Radiol 2013; 10:789-794. 2.  Aortic Atherosclerosis (ICD10-I70.0).   EKG:  EKG is personally reviewed. 09/03/2022:  Sinus bradycardia. Rate 57 bpm. First degree AV block. Cannot rule out prior septal infarct. 03/03/2022: EKG was not ordered. 09/11/2021 Gillian Shields, NP): NSR 83 bpm with no acute ST/T wave changes.     Recent Labs: No  results found for requested labs within last 365 days.   Recent Lipid Panel    Component Value Date/Time   CHOL 130 12/24/2021 0821   TRIG 72 12/24/2021 0821   HDL 68 12/24/2021 0821   CHOLHDL 1.9 12/24/2021 0821   LDLCALC 47 12/24/2021 0821    Physical Exam:    VS:  BP (!) 162/68   Pulse 80   Ht 5\' 1"  (1.549 m)   Wt 192 lb 12.8 oz (87.5 kg)   SpO2 98%   BMI 36.43 kg/m  , BMI Body mass index is 36.43 kg/m. GENERAL:  Well appearing HEENT: Pupils equal round and reactive, fundi not visualized, oral mucosa unremarkable NECK:  No jugular venous distention, waveform within normal limits, carotid upstroke brisk  and symmetric, no bruits, no thyromegaly. LUNGS:  Clear to auscultation bilaterally HEART:  RRR.  PMI not displaced or sustained,S1 and S2 within normal limits, no S3, no S4, no clicks, no rubs, 2/6 systolic murmur. ABD:  Flat, positive bowel sounds normal in frequency in pitch, no bruits, no rebound, no guarding, no midline pulsatile mass, no hepatomegaly, no splenomegaly EXT:  2 plus pulses throughout, no edema, no cyanosis no clubbing SKIN:  No rashes no nodules NEURO:  Cranial nerves II through XII grossly intact, motor grossly intact throughout PSYCH:  Cognitively intact, oriented to person place and time   ASSESSMENT/PLAN:    # Hypertension Elevated blood pressure readings at home, possibly related to inconsistent exercise and diet, as well as alcohol consumption. Patient is currently on Diltiazem, Hydrochlorothiazide, Spironolactone, and Valsartan. -Encourage consistent exercise, including walking and at-home calisthenics. -Refer to Healthy Weight and Wellness program for dietary guidance. -Limit alcohol consumption to two beers or less per day. -Recheck blood pressure in 3-4 months.  #  Non-obstructive CAD:  # Hyperlipidemia Last cholesterol check in October showed slightly elevated triglycerides. Patient is currently on Rosuvastatin and Fenofibrate. -Encourage  reduction in dietary sugar and carbohydrates to improve triglyceride levels. -Recheck cholesterol levels in 3-4 months. - LDL goal <70.  # Gastroesophageal Reflux Disease (GERD) Reports of discomfort that may be acid reflux, particularly after eating and lying down quickly. -Advise to allow a couple of hours between eating and lying down to reduce reflux symptoms.  # Cardiovascular Health Patient expressed concern about exercising due to potential heart condition. CT scan in July showed minimal blockage (<25%) and echocardiogram showed normal heart function. -Reassure patient that he can exercise without restrictions based on current cardiovascular health. -Encourage regular exercise as part of hypertension management.  # Weight Management # Obestiy: Patient reports difficulty maintaining a consistent weight and struggles with portion control. -Refer to Healthy Weight and Wellness program for dietary guidance and support. -Encourage regular exercise as part of weight management strategy.  Follow-up in 3-4 months to reassess blood pressure, cholesterol levels, and progress with lifestyle modifications.     Screening for Secondary Hypertension:     09/11/2021   12:58 PM  Causes  Sleep Apnea Screened     - Comments Snores but wakes well rested. Consider sleep study at follow up.  Thyroid Disease Screened     - Comments TSH ordered  Coarctation of the Aorta Screened     - Comments BP symmetrical  Compliance Screened    Relevant Labs/Studies:    Latest Ref Rng & Units 02/13/2022    9:04 AM 12/24/2021    8:21 AM 10/10/2021    9:25 AM  Basic Labs  Sodium 134 - 144 mmol/L 141  139  142   Potassium 3.5 - 5.2 mmol/L 4.2  4.3  4.1   Creatinine 0.76 - 1.27 mg/dL 4.09  8.11  9.14        Latest Ref Rng & Units 09/18/2021    8:08 AM  Thyroid   TSH 0.450 - 4.500 uIU/mL 1.510     Disposition:    FU with APP in 3-4 months  Medication Adjustments/Labs and Tests Ordered: Current  medicines are reviewed at length with the patient today.  Concerns regarding medicines are outlined above.   Orders Placed This Encounter  Procedures   Ambulatory referral to [facility]   No orders of the defined types were placed in this encounter.   Signed, Chilton Si, MD  05/07/2023 10:26  AM    [facility]

## 2023-05-12 ENCOUNTER — Other Ambulatory Visit: Payer: Self-pay | Admitting: Gastroenterology

## 2023-05-12 DIAGNOSIS — K7401 Hepatic fibrosis, early fibrosis: Secondary | ICD-10-CM

## 2023-05-13 ENCOUNTER — Other Ambulatory Visit (HOSPITAL_BASED_OUTPATIENT_CLINIC_OR_DEPARTMENT_OTHER): Payer: Self-pay | Admitting: *Deleted

## 2023-05-13 DIAGNOSIS — I10 Essential (primary) hypertension: Secondary | ICD-10-CM

## 2023-05-13 MED ORDER — HYDROCHLOROTHIAZIDE 25 MG PO TABS: 25.0000 mg | ORAL_TABLET | Freq: Every day | ORAL | 3 refills | Status: DC

## 2023-05-20 ENCOUNTER — Other Ambulatory Visit: Payer: Medicare HMO

## 2023-05-26 ENCOUNTER — Other Ambulatory Visit: Payer: Medicare HMO

## 2023-05-28 ENCOUNTER — Other Ambulatory Visit: Payer: Medicare HMO

## 2023-05-31 ENCOUNTER — Ambulatory Visit
Admission: RE | Admit: 2023-05-31 | Discharge: 2023-05-31 | Disposition: A | Discharge status: Home / Self Care | Payer: Medicare HMO | Source: Ambulatory Visit | Attending: Gastroenterology | Admitting: Gastroenterology

## 2023-05-31 DIAGNOSIS — K7401 Hepatic fibrosis, early fibrosis: Secondary | ICD-10-CM

## 2023-05-31 DIAGNOSIS — K769 Liver disease, unspecified: Secondary | ICD-10-CM | POA: Diagnosis not present

## 2023-06-24 ENCOUNTER — Other Ambulatory Visit (HOSPITAL_BASED_OUTPATIENT_CLINIC_OR_DEPARTMENT_OTHER): Payer: Self-pay | Admitting: *Deleted

## 2023-06-24 DIAGNOSIS — I10 Essential (primary) hypertension: Secondary | ICD-10-CM

## 2023-06-24 MED ORDER — VALSARTAN 320 MG PO TABS: 320.0000 mg | ORAL_TABLET | Freq: Every day | ORAL | 3 refills | Status: AC

## 2023-08-04 ENCOUNTER — Telehealth (HOSPITAL_BASED_OUTPATIENT_CLINIC_OR_DEPARTMENT_OTHER): Payer: Self-pay | Admitting: *Deleted

## 2023-08-04 NOTE — Telephone Encounter (Signed)
 Copied from CRM [id number]. Topic: General - Other >> Aug 04, 2023  8:13 AM Carlatta H wrote: Reason for CRM: Please call patient to reschedule imaging//

## 2023-08-04 NOTE — Telephone Encounter (Signed)
 Not our patient please reroute

## 2023-08-05 ENCOUNTER — Encounter (HOSPITAL_BASED_OUTPATIENT_CLINIC_OR_DEPARTMENT_OTHER): Payer: Medicare HMO | Admitting: Cardiovascular Disease

## 2023-08-05 NOTE — Progress Notes (Deleted)
 Advanced Hypertension Clinic Follow-up:    Date:  08/05/2023   ID:  Shane Cabrera, DOB 1950/06/14, MRN [id number]  PCP:  Suzzette Eth, MD (Inactive)  Cardiologist:  None   Referring MD: No ref. provider found   CC: Hypertension  History of Present Illness:    Shane Cabrera is a 72 y.o. male with a hx of hypertension, hyperlipidemia, prediabetes, GERD, allergic rhinitis, ED, BPH, hepatic fibrosis, depression, and anxiety, here for follow-up. He was initially seen 09/11/2021 by Neomi Banks, NP in the [facility]. He had been struggling with labile hypertension for more than 10 years. At home his BP was averaging 160s-170s/60s-70s. He was following a low sodium diet. Lisinopril-HCTZ was discontinued. Started on Valsartan  160 mg daily and HCTZ 25 mg daily. Continued on diltiazem 480 mg QD. He was enrolled in the [facility] RPM study. He was also referred to PREP. Also, he had complained of chest pain when lying down after meals or while walking. A cardiac CTA was ordered and showed a coronary calcium  score of 1 with minimal nonobstructive coronary disease. Rosuvastatin  was increased to 40 mg daily, and he was started on 81 mg ASA daily.   Labs from 09/19/21 revealed normal kidney function, electrolytes, and thyroid . His home blood pressure readings were unchanged, so valsartan  was increased to 320 mg daily. On 10/14/2021 blood pressures per [facility] RPM were still not at goal. Spironolactone  12.5 mg daily was added, but he also expressed concerns about medication interactions with sildenafil  which was then increased to 50 mg.  He followed up with our pharmacist 01/26/2022 and his blood pressure was 150/68 on valsartan , HCTZ, diltiazem, and spironolactone . In [facility] his BP averaged 136/72 at home for the prior month. Spironolactone  was increased to 25 mg as he had difficulty cutting tablets in half. Planned to consider cutting back his diltiazem and transition to amlodipine at his  next visit.   At his visit 02/2022, blood pressure in the office was uncontrolled but readings in [facility] had been averaging 120s/60s-70s. 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 He completed the [facility] study. He noted some fluctuation in blood pressures after eating salty meals. He was encouraged to continue working on his diet and exercise.   At his visit 08/2022 he was feeling well.  Home blood pressure readings were in the 130s to 140s initially but improved to 120 after resting.  He was noted to have systolic murmur on exam.  Echocardiogram revealed LVEF 50-55% with grade 1 diastolic dysfunction.  There were no valvular abnormalities.  On 04/2023 he noted lack of energy and a difficult for him to exercise.  Home blood pressures were in the 130s to 140s.  In the office it was 162/68.  We discussed limiting alcohol intake and referred him to healthy weight and wellness.  He  Discussed the use of AI scribe software for clinical note transcription with the patient, who gave verbal consent to proceed.  History of Present Illness    Previous antihypertensives: Lisinopril-HCTZ  Past Medical History:  Diagnosis Date   CAD in native artery 03/05/2022   Hyperlipidemia    Hypertension    Murmur 09/03/2022   Prediabetes    White coat syndrome with diagnosis of hypertension 01/26/2022    Past Surgical History:  Procedure Laterality Date   COLONOSCOPY  2016   NASAL SEPTOPLASTY W/ TURBINOPLASTY Bilateral 07/09/2020   Procedure: NASAL SEPTOPLASTY WITH BILATERAL  TURBINATE REDUCTION;  Surgeon: Reynold Caves, MD;  Location: [facility];  Service: ENT;  Laterality: Bilateral;    Current Medications: No outpatient medications have been marked as taking for the 08/05/23 encounter (Appointment) with Maudine Sos, MD.     Allergies:   Montelukast   Social History   Socioeconomic History   Marital status: Unknown    Spouse name: Not on file    Number of children: Not on file   Years of education: Not on file   Highest education level: Not on file  Occupational History   Not on file  Tobacco Use   Smoking status: Former   Smokeless tobacco: Never  Substance and Sexual Activity   Alcohol use: Yes    Comment: 3-4x per week   Drug use: Not Currently   Sexual activity: Not on file  Other Topics Concern   Not on file  Social History Narrative   Not on file   Social Drivers of Health   Financial Resource Strain: Low Risk  (09/11/2021)   Overall Financial Resource Strain (CARDIA)    Difficulty of Paying Living Expenses: Not very hard  Food Insecurity: Food Insecurity Present (09/11/2021)   Hunger Vital Sign    Worried About Running Out of Food in the Last Year: Sometimes true    Ran Out of Food in the Last Year: Sometimes true  Transportation Needs: No Transportation Needs (09/11/2021)   PRAPARE - Administrator, Civil Service (Medical): No    Lack of Transportation (Non-Medical): No  Physical Activity: Insufficiently Active (09/11/2021)   Exercise Vital Sign    Days of Exercise per Week: 1 day    Minutes of Exercise per Session: 10 min  Stress: No Stress Concern Present (09/11/2021)   [employer] of Occupational Health - Occupational Stress Questionnaire    Feeling of Stress : Only a little  Social Connections: Socially Integrated (09/11/2021)   Social Connection and Isolation Panel [NHANES]    Frequency of Communication with Friends and Family: Twice a week    Frequency of Social Gatherings with Friends and Family: Once a week    Attends Religious Services: More than 4 times per year    Active Member of [employer] or Organizations: Yes    Attends Engineer, structural: More than 4 times per year    Marital Status: Married     Family History: The patient's family history includes Heart attack in his brother, mother, and sister; Hypertension in his mother.  ROS:   Please see the history of present  illness.    All other systems reviewed and are negative.  EKGs/Labs/Other Studies Reviewed:    Echo 11/2022: IMPRESSIONS     1. Left ventricular ejection fraction, by estimation, is 50 to 55%. The  left ventricle has low normal function. The left ventricle has no regional  wall motion abnormalities. Left ventricular diastolic parameters are  consistent with Grade I diastolic  dysfunction (impaired relaxation).   2. Right ventricular systolic function is normal. The right ventricular  size is normal.   3. The mitral valve is normal in structure. Mild mitral valve  regurgitation. No evidence of mitral stenosis.   4. The aortic valve is normal in structure. Aortic valve regurgitation is  not visualized. No aortic stenosis is present.   5. The inferior vena cava is normal in size with greater than 50%  respiratory variability, suggesting right atrial pressure of 3 mmHg.   Coronary CTA  10/20/2021: IMPRESSION: 1. Coronary calcium  score of 1.0. This was 13th percentile  for age-, sex, and race-matched controls.   2. Normal coronary origin with right dominance.   3. Minimal mixed density plaque (<25%) in the LAD.   4. Minimal non-calcified plaque (<25%) in the ramus.   5. Normal LCX/RCA.  US  Aorta Medicare Screening  11/19/2017: IMPRESSION: 1. Mild ectasia of the abdominal aorta measuring 2.8 cm in maximal diameter. Recommend follow-up aortic ultrasound in 5 years. This recommendation follows ACR consensus guidelines: White Paper of the ACR Incidental Findings Committee II on Vascular Findings. J Am Coll Radiol 2013; 10:789-794. 2.  Aortic Atherosclerosis (ICD10-I70.0).   EKG:  EKG is personally reviewed. 09/03/2022:  Sinus bradycardia. Rate 57 bpm. First degree AV block. Cannot rule out prior septal infarct. 03/03/2022: EKG was not ordered. 09/11/2021 Neomi Banks, NP): NSR 83 bpm with no acute ST/T wave changes.     Recent Labs: No results found for requested labs within  last 365 days.   Recent Lipid Panel    Component Value Date/Time   CHOL 130 12/24/2021 0821   TRIG 72 12/24/2021 0821   HDL 68 12/24/2021 0821   CHOLHDL 1.9 12/24/2021 0821   LDLCALC 47 12/24/2021 0821    Physical Exam:    VS:  There were no vitals taken for this visit. , BMI There is no height or weight on file to calculate BMI. GENERAL:  Well appearing HEENT: Pupils equal round and reactive, fundi not visualized, oral mucosa unremarkable NECK:  No jugular venous distention, waveform within normal limits, carotid upstroke brisk and symmetric, no bruits, no thyromegaly. LUNGS:  Clear to auscultation bilaterally HEART:  RRR.  PMI not displaced or sustained,S1 and S2 within normal limits, no S3, no S4, no clicks, no rubs, 2/6 systolic murmur. ABD:  Flat, positive bowel sounds normal in frequency in pitch, no bruits, no rebound, no guarding, no midline pulsatile mass, no hepatomegaly, no splenomegaly EXT:  2 plus pulses throughout, no edema, no cyanosis no clubbing SKIN:  No rashes no nodules NEURO:  Cranial nerves II through XII grossly intact, motor grossly intact throughout PSYCH:  Cognitively intact, oriented to person place and time   ASSESSMENT/PLAN:    Assessment and Plan Assessment & Plan    Screening for Secondary Hypertension:     09/11/2021   12:58 PM  Causes  Sleep Apnea Screened     - Comments Snores but wakes well rested. Consider sleep study at follow up.  Thyroid  Disease Screened     - Comments TSH ordered  Coarctation of the Aorta Screened     - Comments BP symmetrical  Compliance Screened    Relevant Labs/Studies:    Latest Ref Rng & Units 02/13/2022    9:04 AM 12/24/2021    8:21 AM 10/10/2021    9:25 AM  Basic Labs  Sodium 134 - 144 mmol/L 141  139  142   Potassium 3.5 - 5.2 mmol/L 4.2  4.3  4.1   Creatinine 0.76 - 1.27 mg/dL 0.27  2.53  6.64        Latest Ref Rng & Units 09/18/2021    8:08 AM  Thyroid    TSH 0.450 - 4.500 uIU/mL 1.510      Disposition:    FU with APP in 3-4 months  Medication Adjustments/Labs and Tests Ordered: Current medicines are reviewed at length with the patient today.  Concerns regarding medicines are outlined above.   No orders of the defined types were placed in this encounter.  No orders of the defined types were placed in  this encounter.   Signed, Maudine Sos, MD  08/05/2023 8:10 AM    [facility]

## 2023-08-31 DIAGNOSIS — L821 Other seborrheic keratosis: Secondary | ICD-10-CM | POA: Diagnosis not present

## 2023-09-07 ENCOUNTER — Telehealth (HOSPITAL_BASED_OUTPATIENT_CLINIC_OR_DEPARTMENT_OTHER): Payer: Self-pay | Admitting: *Deleted

## 2023-09-07 ENCOUNTER — Encounter (HOSPITAL_BASED_OUTPATIENT_CLINIC_OR_DEPARTMENT_OTHER): Admitting: Cardiovascular Disease

## 2023-09-07 NOTE — Telephone Encounter (Signed)
 Copied from CRM [id number]. Topic: Appointments - Appointment Cancel/Reschedule >> Sep 07, 2023 12:45 PM Rosaria Common wrote: Pt calling to reschedule appt and not be marked as a no show. Callback number is [phone].

## 2023-09-07 NOTE — Progress Notes (Deleted)
 Advanced Hypertension Clinic Follow-up:    Date:  09/07/2023   ID:  Shane Cabrera, DOB Sep 18, 1950, MRN [id number]  PCP:  Suzzette Eth, MD (Inactive)  Cardiologist:  None   Referring MD: No ref. provider found   CC: Hypertension  History of Present Illness:    Shane Cabrera is a 73 y.o. male with a hx of nonobstructive CAD, hypertension, hyperlipidemia, prediabetes, GERD, allergic rhinitis, ED, BPH, hepatic fibrosis, depression, and anxiety, here for follow-up. He was initially seen 09/11/2021 by Neomi Banks, NP in the [facility]. He had been struggling with labile hypertension for more than 10 years. At home his BP was averaging 160s-170s/60s-70s. He was following a low sodium diet. Lisinopril-HCTZ was discontinued. Started on Valsartan  160 mg daily and HCTZ 25 mg daily. Continued on diltiazem 480 mg QD. He was enrolled in the [facility] RPM study. He was also referred to PREP. Also, he had complained of chest pain when lying down after meals or while walking. A cardiac CTA was ordered and showed a coronary calcium  score of 1 with minimal nonobstructive coronary disease. Rosuvastatin  was increased to 40 mg daily, and he was started on 81 mg ASA daily.   Labs from 09/19/21 revealed normal kidney function, electrolytes, and thyroid . His home blood pressure readings were unchanged, so valsartan  was increased to 320 mg daily. On 10/14/2021 blood pressures per Viv[facility] RPM were still not at goal. Spironolactone  12.5 mg daily was added, but he also expressed concerns about medication interactions with sildenafil  which was then increased to 50 mg.  He followed up with our pharmacist 01/26/2022 and his blood pressure was 150/68 on valsartan , HCTZ, diltiazem, and spironolactone . In [facility] his BP averaged 136/72 at home for the prior month. Spironolactone  was increased to 25 mg as he had difficulty cutting tablets in half. Planned to consider cutting back his diltiazem and transition to  amlodipine at his next visit.   At his visit 02/2022, blood pressure in the office was uncontrolled but readings in [facility] had been averaging 120s/60s-70s. 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 He completed the Vivify study. He noted some fluctuation in blood pressures after eating salty meals. He was encouraged to continue working on his diet and exercise.   At his visit 08/2022 he was feeling well.  Home blood pressure readings were in the 130s to 140s initially but improved to 120 after resting.  He was noted to have systolic murmur on exam.  Echocardiogram revealed LVEF 50-55% with grade 1 diastolic dysfunction.  There were no valvular abnormalities.  On 04/2023 he noted some exertional dyspnea.  Blood pressures were in the 130s to 140s at home but more elevated in the office.  He was instructed to reduce alcohol intake and referred to the healthy weight and wellness program.  Discussed the use of AI scribe software for clinical note transcription with the patient, who gave verbal consent to proceed.  History of Present Illness     Previous antihypertensives: Lisinopril-HCTZ  Past Medical History:  Diagnosis Date   CAD in native artery 03/05/2022   Hyperlipidemia    Hypertension    Murmur 09/03/2022   Prediabetes    White coat syndrome with diagnosis of hypertension 01/26/2022    Past Surgical History:  Procedure Laterality Date   COLONOSCOPY  2016   NASAL SEPTOPLASTY W/ TURBINOPLASTY Bilateral 07/09/2020   Procedure: NASAL SEPTOPLASTY WITH BILATERAL  TURBINATE REDUCTION;  Surgeon: Reynold Caves, MD;  Location: [facility];  Service: ENT;  Laterality: Bilateral;    Current Medications: No outpatient medications have been marked as taking for the 09/07/23 encounter (Appointment) with Maudine Sos, MD.     Allergies:   Montelukast   Social History   Socioeconomic History   Marital status: Unknown    Spouse name: Not on file    Number of children: Not on file   Years of education: Not on file   Highest education level: Not on file  Occupational History   Not on file  Tobacco Use   Smoking status: Former   Smokeless tobacco: Never  Substance and Sexual Activity   Alcohol use: Yes    Comment: 3-4x per week   Drug use: Not Currently   Sexual activity: Not on file  Other Topics Concern   Not on file  Social History Narrative   Not on file   Social Drivers of Health   Financial Resource Strain: Low Risk  (09/11/2021)   Overall Financial Resource Strain (CARDIA)    Difficulty of Paying Living Expenses: Not very hard  Food Insecurity: Food Insecurity Present (09/11/2021)   Hunger Vital Sign    Worried About Running Out of Food in the Last Year: Sometimes true    Ran Out of Food in the Last Year: Sometimes true  Transportation Needs: No Transportation Needs (09/11/2021)   PRAPARE - Administrator, Civil Service (Medical): No    Lack of Transportation (Non-Medical): No  Physical Activity: Insufficiently Active (09/11/2021)   Exercise Vital Sign    Days of Exercise per Week: 1 day    Minutes of Exercise per Session: 10 min  Stress: No Stress Concern Present (09/11/2021)   [employer] of Occupational Health - Occupational Stress Questionnaire    Feeling of Stress : Only a little  Social Connections: Socially Integrated (09/11/2021)   Social Connection and Isolation Panel [NHANES]    Frequency of Communication with Friends and Family: Twice a week    Frequency of Social Gatherings with Friends and Family: Once a week    Attends Religious Services: More than 4 times per year    Active Member of [employer] or Organizations: Yes    Attends Engineer, structural: More than 4 times per year    Marital Status: Married     Family History: The patient's family history includes Heart attack in his brother, mother, and sister; Hypertension in his mother.  ROS:   Please see the history of present  illness.    All other systems reviewed and are negative.  EKGs/Labs/Other Studies Reviewed:    Echo 11/2022: IMPRESSIONS     1. Left ventricular ejection fraction, by estimation, is 50 to 55%. The  left ventricle has low normal function. The left ventricle has no regional  wall motion abnormalities. Left ventricular diastolic parameters are  consistent with Grade I diastolic  dysfunction (impaired relaxation).   2. Right ventricular systolic function is normal. The right ventricular  size is normal.   3. The mitral valve is normal in structure. Mild mitral valve  regurgitation. No evidence of mitral stenosis.   4. The aortic valve is normal in structure. Aortic valve regurgitation is  not visualized. No aortic stenosis is present.   5. The inferior vena cava is normal in size with greater than 50%  respiratory variability, suggesting right atrial pressure of 3 mmHg.   Coronary CTA  10/20/2021: IMPRESSION: 1. Coronary calcium  score of 1.0. This was 13th percentile for age-, sex, and  race-matched controls.   2. Normal coronary origin with right dominance.   3. Minimal mixed density plaque (<25%) in the LAD.   4. Minimal non-calcified plaque (<25%) in the ramus.   5. Normal LCX/RCA.  US  Aorta Medicare Screening  11/19/2017: IMPRESSION: 1. Mild ectasia of the abdominal aorta measuring 2.8 cm in maximal diameter. Recommend follow-up aortic ultrasound in 5 years. This recommendation follows ACR consensus guidelines: White Paper of the ACR Incidental Findings Committee II on Vascular Findings. J Am Coll Radiol 2013; 10:789-794. 2.  Aortic Atherosclerosis (ICD10-I70.0).   EKG:  EKG is personally reviewed. 09/03/2022:  Sinus bradycardia. Rate 57 bpm. First degree AV block. Cannot rule out prior septal infarct. 03/03/2022: EKG was not ordered. 09/11/2021 Neomi Banks, NP): NSR 83 bpm with no acute ST/T wave changes.     Recent Labs: No results found for requested labs within  last 365 days.   Recent Lipid Panel    Component Value Date/Time   CHOL 130 12/24/2021 0821   TRIG 72 12/24/2021 0821   HDL 68 12/24/2021 0821   CHOLHDL 1.9 12/24/2021 0821   LDLCALC 47 12/24/2021 0821    Physical Exam:    VS:  There were no vitals taken for this visit. , BMI There is no height or weight on file to calculate BMI. GENERAL:  Well appearing HEENT: Pupils equal round and reactive, fundi not visualized, oral mucosa unremarkable NECK:  No jugular venous distention, waveform within normal limits, carotid upstroke brisk and symmetric, no bruits, no thyromegaly. LUNGS:  Clear to auscultation bilaterally HEART:  RRR.  PMI not displaced or sustained,S1 and S2 within normal limits, no S3, no S4, no clicks, no rubs, 2/6 systolic murmur. ABD:  Flat, positive bowel sounds normal in frequency in pitch, no bruits, no rebound, no guarding, no midline pulsatile mass, no hepatomegaly, no splenomegaly EXT:  2 plus pulses throughout, no edema, no cyanosis no clubbing SKIN:  No rashes no nodules NEURO:  Cranial nerves II through XII grossly intact, motor grossly intact throughout PSYCH:  Cognitively intact, oriented to person place and time   ASSESSMENT/PLAN:          Screening for Secondary Hypertension:     09/11/2021   12:58 PM  Causes  Sleep Apnea Screened     - Comments Snores but wakes well rested. Consider sleep study at follow up.  Thyroid  Disease Screened     - Comments TSH ordered  Coarctation of the Aorta Screened     - Comments BP symmetrical  Compliance Screened    Relevant Labs/Studies:    Latest Ref Rng & Units 02/13/2022    9:04 AM 12/24/2021    8:21 AM 10/10/2021    9:25 AM  Basic Labs  Sodium 134 - 144 mmol/L 141  139  142   Potassium 3.5 - 5.2 mmol/L 4.2  4.3  4.1   Creatinine 0.76 - 1.27 mg/dL 4.09  8.11  9.14        Latest Ref Rng & Units 09/18/2021    8:08 AM  Thyroid    TSH 0.450 - 4.500 uIU/mL 1.510     Disposition:    FU with APP in 3-4  months  Medication Adjustments/Labs and Tests Ordered: Current medicines are reviewed at length with the patient today.  Concerns regarding medicines are outlined above.   No orders of the defined types were placed in this encounter.  No orders of the defined types were placed in this encounter.   Signed, Maudine Sos,  MD  09/07/2023 12:57 PM    [facility]

## 2023-10-07 ENCOUNTER — Other Ambulatory Visit (HOSPITAL_BASED_OUTPATIENT_CLINIC_OR_DEPARTMENT_OTHER): Payer: Self-pay | Admitting: Family

## 2023-12-03 ENCOUNTER — Other Ambulatory Visit: Payer: Self-pay | Admitting: Gastroenterology

## 2023-12-03 DIAGNOSIS — K7400 Hepatic fibrosis, unspecified: Secondary | ICD-10-CM

## 2023-12-14 ENCOUNTER — Ambulatory Visit
Admission: RE | Admit: 2023-12-14 | Discharge: 2023-12-14 | Disposition: A | Discharge status: Home / Self Care | Source: Ambulatory Visit | Attending: Gastroenterology | Admitting: Gastroenterology

## 2023-12-14 DIAGNOSIS — K7400 Hepatic fibrosis, unspecified: Secondary | ICD-10-CM

## 2023-12-14 DIAGNOSIS — K760 Fatty (change of) liver, not elsewhere classified: Secondary | ICD-10-CM | POA: Diagnosis not present

## 2023-12-15 ENCOUNTER — Encounter: Payer: Self-pay | Admitting: *Deleted

## 2023-12-16 ENCOUNTER — Encounter (HOSPITAL_BASED_OUTPATIENT_CLINIC_OR_DEPARTMENT_OTHER): Admitting: Cardiovascular Disease

## 2023-12-27 ENCOUNTER — Encounter (HOSPITAL_BASED_OUTPATIENT_CLINIC_OR_DEPARTMENT_OTHER): Admitting: Cardiovascular Disease

## 2023-12-27 NOTE — Progress Notes (Deleted)
 Advanced Hypertension Clinic Follow-up:    Date:  12/27/2023   ID:  Shane Cabrera, DOB 1950/10/06, MRN [id number]  PCP:  Cyrena Gwenn SQUIBB, MD (Inactive)  Cardiologist:  None   Referring MD: No ref. provider found   CC: Hypertension  History of Present Illness:    Shane Cabrera is a 73 y.o. male with a hx of white coat syndrome and primary hypertension, hyperlipidemia, prediabetes, GERD, allergic rhinitis, ED, BPH, hepatic fibrosis, depression, and anxiety, here for follow-up. He was initially seen 09/11/2021 by Reche Finder, NP in the [facility]. He had been struggling with labile hypertension for more than 10 years. At home his BP was averaging 160s-170s/60s-70s. He was following a low sodium diet. Lisinopril-HCTZ was discontinued. Started on Valsartan  160 mg daily and HCTZ 25 mg daily. Continued on diltiazem 480 mg QD. He was enrolled in the [facility] RPM study. He was also referred to [facility]. Also, he had complained of chest pain when lying down after meals or while walking. A cardiac CTA was ordered and showed a coronary calcium  score of 1 with minimal nonobstructive coronary disease. Rosuvastatin  was increased to 40 mg daily, and he was started on 81 mg ASA daily.   Labs from 09/19/21 revealed normal kidney function, electrolytes, and thyroid . His home blood pressure readings were unchanged, so valsartan  was increased to 320 mg daily. On 10/14/2021 blood pressures per Vivify RPM were still not at goal. Spironolactone  12.5 mg daily was added, but he also expressed concerns about medication interactions with sildenafil  which was then increased to 50 mg.  He followed up with our pharmacist 01/26/2022 and his blood pressure was 150/68 on valsartan , HCTZ, diltiazem, and spironolactone . In [facility] his BP averaged 136/72 at home for the prior month. Spironolactone  was increased to 25 mg as he had difficulty cutting tablets in half. Planned to consider cutting back his diltiazem and  transition to amlodipine at his next visit.   At his visit 02/2022, blood pressure in the office was uncontrolled but readings in [facility] had been averaging 120s/60s-70s. 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 He completed the Vivify study. He noted some fluctuation in blood pressures after eating salty meals. He was encouraged to continue working on his diet and exercise.   At his visit 08/2022 he was feeling well.  Home blood pressure readings were in the 130s to 140s initially but improved to 120 after resting.  He was noted to have systolic murmur on exam.  Echocardiogram revealed LVEF 50-55% with grade 1 diastolic dysfunction.  There were no valvular abnormalities.  At his visit 04/2023 he noted exercise limitation and wondered if it was related to his heart.  Home BP was in the 130s-140.  He was referred to [facility] but has not utilized it.    Discussed the use of AI scribe software for clinical note transcription with the patient, who gave verbal consent to proceed.  History of Present Illness    Previous antihypertensives: Lisinopril-HCTZ  Past Medical History:  Diagnosis Date   CAD in native artery 03/05/2022   Hyperlipidemia    Hypertension    Murmur 09/03/2022   Prediabetes    White coat syndrome with diagnosis of hypertension 01/26/2022    Past Surgical History:  Procedure Laterality Date   COLONOSCOPY  2016   NASAL SEPTOPLASTY W/ TURBINOPLASTY Bilateral 07/09/2020   Procedure: NASAL SEPTOPLASTY WITH BILATERAL  TURBINATE REDUCTION;  Surgeon: Karis Clunes, MD;  Location: [facility];  Service: ENT;  Laterality: Bilateral;    Current Medications: No outpatient medications have been marked as taking for the 12/27/23 encounter (Appointment) with Raford Riggs, MD.     Allergies:   Montelukast   Social History   Socioeconomic History   Marital status: Unknown    Spouse name: Not on file   Number of  children: Not on file   Years of education: Not on file   Highest education level: Not on file  Occupational History   Not on file  Tobacco Use   Smoking status: Former   Smokeless tobacco: Never  Substance and Sexual Activity   Alcohol use: Yes    Comment: 3-4x per week   Drug use: Not Currently   Sexual activity: Not on file  Other Topics Concern   Not on file  Social History Narrative   Not on file   Social Drivers of Health   Financial Resource Strain: Low Risk  (09/11/2021)   Overall Financial Resource Strain (CARDIA)    Difficulty of Paying Living Expenses: Not very hard  Food Insecurity: Food Insecurity Present (09/11/2021)   Hunger Vital Sign    Worried About Running Out of Food in the Last Year: Sometimes true    Ran Out of Food in the Last Year: Sometimes true  Transportation Needs: No Transportation Needs (09/11/2021)   PRAPARE - Administrator, Civil Service (Medical): No    Lack of Transportation (Non-Medical): No  Physical Activity: Insufficiently Active (09/11/2021)   Exercise Vital Sign    Days of Exercise per Week: 1 day    Minutes of Exercise per Session: 10 min  Stress: No Stress Concern Present (09/11/2021)   [employer] of Occupational Health - Occupational Stress Questionnaire    Feeling of Stress : Only a little  Social Connections: Socially Integrated (09/11/2021)   Social Connection and Isolation Panel    Frequency of Communication with Friends and Family: Twice a week    Frequency of Social Gatherings with Friends and Family: Once a week    Attends Religious Services: More than 4 times per year    Active Member of [employer] or Organizations: Yes    Attends Engineer, structural: More than 4 times per year    Marital Status: Married     Family History: The patient's family history includes Heart attack in his brother, mother, and sister; Hypertension in his mother.  ROS:   Please see the history of present illness.    All other  systems reviewed and are negative.  EKGs/Labs/Other Studies Reviewed:    Echo 11/2022: IMPRESSIONS     1. Left ventricular ejection fraction, by estimation, is 50 to 55%. The  left ventricle has low normal function. The left ventricle has no regional  wall motion abnormalities. Left ventricular diastolic parameters are  consistent with Grade I diastolic  dysfunction (impaired relaxation).   2. Right ventricular systolic function is normal. The right ventricular  size is normal.   3. The mitral valve is normal in structure. Mild mitral valve  regurgitation. No evidence of mitral stenosis.   4. The aortic valve is normal in structure. Aortic valve regurgitation is  not visualized. No aortic stenosis is present.   5. The inferior vena cava is normal in size with greater than 50%  respiratory variability, suggesting right atrial pressure of 3 mmHg.   Coronary CTA  10/20/2021: IMPRESSION: 1. Coronary calcium  score of 1.0. This was 13th percentile for age-, sex,  and race-matched controls.   2. Normal coronary origin with right dominance.   3. Minimal mixed density plaque (<25%) in the LAD.   4. Minimal non-calcified plaque (<25%) in the ramus.   5. Normal LCX/RCA.  US  Aorta Medicare Screening  11/19/2017: IMPRESSION: 1. Mild ectasia of the abdominal aorta measuring 2.8 cm in maximal diameter. Recommend follow-up aortic ultrasound in 5 years. This recommendation follows ACR consensus guidelines: White Paper of the ACR Incidental Findings Committee II on Vascular Findings. J Am Coll Radiol 2013; 10:789-794. 2.  Aortic Atherosclerosis (ICD10-I70.0).   EKG:  EKG is personally reviewed. 09/03/2022:  Sinus bradycardia. Rate 57 bpm. First degree AV block. Cannot rule out prior septal infarct. 03/03/2022: EKG was not ordered. 09/11/2021 Roxana Finder, NP): NSR 83 bpm with no acute ST/T wave changes.     Recent Labs: No results found for requested labs within last 365 days.    Recent Lipid Panel    Component Value Date/Time   CHOL 130 12/24/2021 0821   TRIG 72 12/24/2021 0821   HDL 68 12/24/2021 0821   CHOLHDL 1.9 12/24/2021 0821   LDLCALC 47 12/24/2021 0821    Physical Exam:    VS:  There were no vitals taken for this visit. , BMI There is no height or weight on file to calculate BMI. GENERAL:  Well appearing HEENT: Pupils equal round and reactive, fundi not visualized, oral mucosa unremarkable NECK:  No jugular venous distention, waveform within normal limits, carotid upstroke brisk and symmetric, no bruits, no thyromegaly. LUNGS:  Clear to auscultation bilaterally HEART:  RRR.  PMI not displaced or sustained,S1 and S2 within normal limits, no S3, no S4, no clicks, no rubs, 2/6 systolic murmur. ABD:  Flat, positive bowel sounds normal in frequency in pitch, no bruits, no rebound, no guarding, no midline pulsatile mass, no hepatomegaly, no splenomegaly EXT:  2 plus pulses throughout, no edema, no cyanosis no clubbing SKIN:  No rashes no nodules NEURO:  Cranial nerves II through XII grossly intact, motor grossly intact throughout PSYCH:  Cognitively intact, oriented to person place and time   ASSESSMENT/PLAN:          Screening for Secondary Hypertension:     09/11/2021   12:58 PM  Causes  Sleep Apnea Screened     - Comments Snores but wakes well rested. Consider sleep study at follow up.  Thyroid  Disease Screened     - Comments TSH ordered  Coarctation of the Aorta Screened     - Comments BP symmetrical  Compliance Screened    Relevant Labs/Studies:    Latest Ref Rng & Units 02/13/2022    9:04 AM 12/24/2021    8:21 AM 10/10/2021    9:25 AM  Basic Labs  Sodium 134 - 144 mmol/L 141  139  142   Potassium 3.5 - 5.2 mmol/L 4.2  4.3  4.1   Creatinine 0.76 - 1.27 mg/dL 8.91  8.93  8.99        Latest Ref Rng & Units 09/18/2021    8:08 AM  Thyroid    TSH 0.450 - 4.500 uIU/mL 1.510     Disposition:    FU with APP in 3-4 months  Medication  Adjustments/Labs and Tests Ordered: Current medicines are reviewed at length with the patient today.  Concerns regarding medicines are outlined above.   No orders of the defined types were placed in this encounter.  No orders of the defined types were placed in this encounter.   Signed, Treyson Axel  Raford, MD  12/27/2023 8:05 AM    [facility]

## 2023-12-29 DIAGNOSIS — K7400 Hepatic fibrosis, unspecified: Secondary | ICD-10-CM | POA: Diagnosis not present

## 2023-12-29 DIAGNOSIS — K31A0 Gastric intestinal metaplasia, unspecified: Secondary | ICD-10-CM | POA: Diagnosis not present

## 2023-12-29 DIAGNOSIS — Z860100 Personal history of colon polyps, unspecified: Secondary | ICD-10-CM | POA: Diagnosis not present

## 2024-02-02 DIAGNOSIS — H52223 Regular astigmatism, bilateral: Secondary | ICD-10-CM | POA: Diagnosis not present

## 2024-02-02 DIAGNOSIS — H524 Presbyopia: Secondary | ICD-10-CM | POA: Diagnosis not present

## 2024-02-07 ENCOUNTER — Inpatient Hospital Stay (HOSPITAL_BASED_OUTPATIENT_CLINIC_OR_DEPARTMENT_OTHER)
Admission: EM | Admit: 2024-02-07 | Discharge: 2024-02-10 | DRG: 281 | Disposition: A | Discharge status: Home / Self Care | Attending: Internal Medicine | Admitting: Internal Medicine

## 2024-02-07 ENCOUNTER — Other Ambulatory Visit: Payer: Self-pay

## 2024-02-07 ENCOUNTER — Encounter (HOSPITAL_BASED_OUTPATIENT_CLINIC_OR_DEPARTMENT_OTHER): Payer: Self-pay | Admitting: Emergency Medicine

## 2024-02-07 ENCOUNTER — Emergency Department (HOSPITAL_BASED_OUTPATIENT_CLINIC_OR_DEPARTMENT_OTHER): Admitting: Radiology

## 2024-02-07 DIAGNOSIS — Z8249 Family history of ischemic heart disease and other diseases of the circulatory system: Secondary | ICD-10-CM

## 2024-02-07 DIAGNOSIS — R7989 Other specified abnormal findings of blood chemistry: Secondary | ICD-10-CM

## 2024-02-07 DIAGNOSIS — N400 Enlarged prostate without lower urinary tract symptoms: Secondary | ICD-10-CM | POA: Diagnosis present

## 2024-02-07 DIAGNOSIS — E66811 Obesity, class 1: Secondary | ICD-10-CM | POA: Diagnosis present

## 2024-02-07 DIAGNOSIS — I10 Essential (primary) hypertension: Secondary | ICD-10-CM | POA: Diagnosis present

## 2024-02-07 DIAGNOSIS — Z79899 Other long term (current) drug therapy: Secondary | ICD-10-CM

## 2024-02-07 DIAGNOSIS — K219 Gastro-esophageal reflux disease without esophagitis: Secondary | ICD-10-CM | POA: Diagnosis present

## 2024-02-07 DIAGNOSIS — I214 Non-ST elevation (NSTEMI) myocardial infarction: Principal | ICD-10-CM | POA: Diagnosis present

## 2024-02-07 DIAGNOSIS — Z6834 Body mass index (BMI) 34.0-34.9, adult: Secondary | ICD-10-CM

## 2024-02-07 DIAGNOSIS — I160 Hypertensive urgency: Secondary | ICD-10-CM | POA: Diagnosis present

## 2024-02-07 DIAGNOSIS — R079 Chest pain, unspecified: Secondary | ICD-10-CM | POA: Diagnosis present

## 2024-02-07 DIAGNOSIS — R072 Precordial pain: Secondary | ICD-10-CM | POA: Diagnosis not present

## 2024-02-07 DIAGNOSIS — F419 Anxiety disorder, unspecified: Secondary | ICD-10-CM | POA: Diagnosis present

## 2024-02-07 DIAGNOSIS — Z87891 Personal history of nicotine dependence: Secondary | ICD-10-CM

## 2024-02-07 DIAGNOSIS — E785 Hyperlipidemia, unspecified: Secondary | ICD-10-CM | POA: Diagnosis present

## 2024-02-07 DIAGNOSIS — Z888 Allergy status to other drugs, medicaments and biological substances status: Secondary | ICD-10-CM

## 2024-02-07 DIAGNOSIS — G4700 Insomnia, unspecified: Secondary | ICD-10-CM | POA: Diagnosis present

## 2024-02-07 DIAGNOSIS — R778 Other specified abnormalities of plasma proteins: Secondary | ICD-10-CM | POA: Diagnosis not present

## 2024-02-07 DIAGNOSIS — Z7951 Long term (current) use of inhaled steroids: Secondary | ICD-10-CM

## 2024-02-07 DIAGNOSIS — N529 Male erectile dysfunction, unspecified: Secondary | ICD-10-CM | POA: Diagnosis present

## 2024-02-07 DIAGNOSIS — K7400 Hepatic fibrosis, unspecified: Secondary | ICD-10-CM | POA: Diagnosis present

## 2024-02-07 DIAGNOSIS — D72829 Elevated white blood cell count, unspecified: Secondary | ICD-10-CM | POA: Diagnosis present

## 2024-02-07 DIAGNOSIS — F32A Depression, unspecified: Secondary | ICD-10-CM | POA: Diagnosis present

## 2024-02-07 DIAGNOSIS — N179 Acute kidney failure, unspecified: Secondary | ICD-10-CM | POA: Diagnosis present

## 2024-02-07 LAB — CBC
HCT: 43.6 % (ref 39.0–52.0)
Hemoglobin: 14.3 g/dL (ref 13.0–17.0)
MCH: 27 pg (ref 26.0–34.0)
MCHC: 32.8 g/dL (ref 30.0–36.0)
MCV: 82.3 fL (ref 80.0–100.0)
Platelets: 169 K/uL (ref 150–400)
RBC: 5.3 MIL/uL (ref 4.22–5.81)
RDW: 14.9 % (ref 11.5–15.5)
WBC: 16.1 K/uL — ABNORMAL HIGH (ref 4.0–10.5)
nRBC: 0 % (ref 0.0–0.2)

## 2024-02-07 LAB — BASIC METABOLIC PANEL WITH GFR
Anion gap: 12 (ref 5–15)
BUN: 17 mg/dL (ref 8–23)
CO2: 24 mmol/L (ref 22–32)
Calcium: 10.5 mg/dL — ABNORMAL HIGH (ref 8.9–10.3)
Chloride: 103 mmol/L (ref 98–111)
Creatinine, Ser: 1.29 mg/dL — ABNORMAL HIGH (ref 0.61–1.24)
GFR, Estimated: 58 mL/min — ABNORMAL LOW (ref >60)
Glucose, Bld: 132 mg/dL — ABNORMAL HIGH (ref 70–99)
Potassium: 4 mmol/L (ref 3.5–5.1)
Sodium: 138 mmol/L (ref 135–145)

## 2024-02-07 LAB — TROPONIN T, HIGH SENSITIVITY
Troponin T High Sensitivity: 379 ng/L (ref 0–19)
Troponin T High Sensitivity: 335 ng/L (ref 0–19)

## 2024-02-07 MED ORDER — ASPIRIN 81 MG PO CHEW
324.0000 mg | CHEWABLE_TABLET | Freq: Once | ORAL | Status: AC
Start: 2024-02-07 — End: 2024-02-07
  Administered 2024-02-07: 324 mg via ORAL
  Filled 2024-02-07: qty 4

## 2024-02-07 NOTE — ED Notes (Signed)
 Patient transported to X-ray

## 2024-02-07 NOTE — ED Triage Notes (Signed)
 Pt reports central CP x 2 days. Denies pain at this time. Took b/p meds @ 0200 and CP resolved

## 2024-02-07 NOTE — ED Provider Notes (Addendum)
 [place] EMERGENCY DEPARTMENT AT [facility] Provider Note   CSN: [id number] Arrival date & time: 02/07/24  9081     Patient presents with: Chest Pain   Shane Cabrera is a 73 y.o. male.   Patient with 2 episodes of chest pain lasting about an hour apiece 1 was this morning.  And the other was the day before.  No chest pain currently.  Patient not on blood thinners not on aspirin .  Patient had CT calcium  scan also had an echo in August 2024 with ejection fraction of 50 to 55% no significant findings on that.  Did have a coronary calcium  score of 1 this was 13 percentile for age sex and race matched controls.  Normal coronary origin with right dominance minimal mixed density plaque less than 25% LAD.  This was done in 2023.  Patient known to have hyperlipidemia and known to have hypertension.  Chest pain was nonradiating and was substernal.  Not associated with nausea vomiting or shortness of breath.  No leg swelling.       Prior to Admission medications   Medication Sig Start Date End Date Taking? Authorizing Provider  albuterol (VENTOLIN HFA) 108 (90 Base) MCG/ACT inhaler Inhale 2 puffs into the lungs as needed for wheezing or shortness of breath. 07/27/22   [provider]  budesonide-formoterol (SYMBICORT) 80-4.5 MCG/ACT inhaler Inhale 2 puffs into the lungs as needed. 07/17/22   [provider]  diltiazem (DILACOR XR) 240 MG 24 hr capsule Take 480 mg by mouth daily.    [provider]  fenofibrate 54 MG tablet Take 54 mg by mouth daily.    [provider]  fluticasone (FLONASE) 50 MCG/ACT nasal spray Place into both nostrils daily.    [provider]  hydrochlorothiazide  (HYDRODIURIL ) 25 MG tablet Take 1 tablet (25 mg total) by mouth daily. 05/13/23   Raford Riggs, MD  loratadine (CLARITIN) 10 MG tablet Take 10 mg by mouth daily.    [provider]  omeprazole (PRILOSEC) 20 MG capsule Take 20 mg by mouth daily.  07/14/22   [provider]  PARoxetine (PAXIL) 10 MG tablet Take 10 mg by mouth daily.    [provider]  rosuvastatin  (CRESTOR ) 40 MG tablet TAKE 1 TABLET BY MOUTH DAILY 10/07/23   Raford Riggs, MD  sildenafil  (VIAGRA ) 50 MG tablet Take 1 tablet (50 mg total) by mouth daily as needed for erectile dysfunction. 10/16/21   Walker, Caitlin S, NP  spironolactone  (ALDACTONE ) 25 MG tablet TAKE 1 TABLET BY MOUTH DAILY 02/24/23   Raford Riggs, MD  tadalafil (CIALIS) 20 MG tablet Take 20 mg by mouth daily as needed for erectile dysfunction.    [provider]  valsartan  (DIOVAN ) 320 MG tablet Take 1 tablet (320 mg total) by mouth daily. 06/24/23   Raford Riggs, MD    Allergies: Montelukast    Review of Systems  Constitutional:  Negative for chills and fever.  HENT:  Negative for ear pain and sore throat.   Eyes:  Negative for pain and visual disturbance.  Respiratory:  Negative for cough and shortness of breath.   Cardiovascular:  Positive for chest pain. Negative for palpitations and leg swelling.  Gastrointestinal:  Negative for abdominal pain, nausea and vomiting.  Genitourinary:  Negative for dysuria and hematuria.  Musculoskeletal:  Negative for arthralgias and back pain.  Skin:  Negative for color change and rash.  Neurological:  Negative for seizures and syncope.  All other systems reviewed and are  negative.   Updated Vital Signs BP (!) 144/76   Pulse (!) 57   Temp 98 F (36.7 C) (Oral)   Resp 18   Wt 83.5 kg   SpO2 100%   BMI 34.77 kg/m   Physical Exam Vitals and nursing note reviewed.  Constitutional:      General: He is not in acute distress.    Appearance: Normal appearance. He is well-developed.  HENT:     Head: Normocephalic and atraumatic.  Eyes:     Conjunctiva/sclera: Conjunctivae normal.     Pupils: Pupils are equal, round, and reactive to light.  Cardiovascular:     Rate and Rhythm: Normal rate and regular rhythm.     Heart  sounds: No murmur heard. Pulmonary:     Effort: Pulmonary effort is normal. No respiratory distress.     Breath sounds: Normal breath sounds. No wheezing, rhonchi or rales.  Chest:     Chest wall: No tenderness.  Abdominal:     Palpations: Abdomen is soft.     Tenderness: There is no abdominal tenderness.  Musculoskeletal:        General: No swelling.     Cervical back: Normal range of motion and neck supple.  Skin:    General: Skin is warm and dry.     Capillary Refill: Capillary refill takes less than 2 seconds.  Neurological:     Mental Status: He is alert.  Psychiatric:        Mood and Affect: Mood normal.     (all labs ordered are listed, but only abnormal results are displayed) Labs Reviewed  BASIC METABOLIC PANEL WITH GFR - Abnormal; Notable for the following components:      Result Value   Glucose, Bld 132 (*)    Creatinine, Ser 1.29 (*)    Calcium  10.5 (*)    GFR, Estimated 58 (*)    All other components within normal limits  CBC - Abnormal; Notable for the following components:   WBC 16.1 (*)    All other components within normal limits  TROPONIN T, HIGH SENSITIVITY - Abnormal; Notable for the following components:   Troponin T High Sensitivity 379 (*)    All other components within normal limits  TROPONIN T, HIGH SENSITIVITY    EKG: EKG Interpretation Date/Time:  Monday February 07 2024 09:29:36 EST Ventricular Rate:  78 PR Interval:  176 QRS Duration:  90 QT Interval:  344 QTC Calculation: 392 R Axis:   41  Text Interpretation: Sinus rhythm Nonspecific T abnormalities, diffuse leads Baseline wander in lead(s) III aVL aVF New since previous tracing Confirmed by Therasa Lorenzi [phone]) on 02/07/2024 10:43:30 AM  Radiology: [facility] Chest 2 View Result Date: 02/07/2024 EXAM: 2 VIEW(S) XRAY OF THE CHEST 02/07/2024 09:38:00 AM COMPARISON: PA and lateral radiographs of the chest dated 01/12/2017. CLINICAL HISTORY: cp FINDINGS: LUNGS AND PLEURA: No focal pulmonary  opacity. No pulmonary edema. No pleural effusion. No pneumothorax. HEART AND MEDIASTINUM: No acute abnormality of the cardiac and mediastinal silhouettes. BONES AND SOFT TISSUES: No acute osseous abnormality. IMPRESSION: 1. No acute process. Electronically signed by: Evalene Coho MD 02/07/2024 10:09 AM EST RP Workstation: [id number]     Procedures   Medications Ordered in the ED - No data to display                                  Medical Decision Making Amount and/or Complexity of  Data Reviewed Labs: ordered. Radiology: ordered.  Risk OTC drugs. Decision regarding hospitalization.   Patient currently chest pain-free.  In 2023 had fairly reassuring calcium  CT.  But there was LAD less than 25% abnormalities.  Initial troponin was elevated at 379 white count 16.1 hemoglobin 14.3 platelets 169.  Delta troponin pending.  Basic metabolic panel electrolytes normal other than GFR at 58 creatinine 1.29.  Glucose 132.  Chest x-ray no acute process.  Will get delta troponin.  But with the first 1 being elevated is concerned it is reassuring that his chest pain-free currently.  Will give full aspirin .  Will hold off on heparin drip.  Patient is primary caregiver for his wife that has significant dementia.  So this could be a dilemma.  He wants to see what the delta troponin shows and then have me talk to cardiology.  But most likely he is going to need admission.  Delta troponin is 335.  Initial troponin was 379.  Will discuss with cardiology.  Discussed with cardiology.  They are recommending medicine admission and they will consult.  Will talk to him about possibility of catheterization.  They are recommending cardiac monitoring and cycling the troponins.  Awaiting call from hospitalist for admission.  Final diagnoses:  Precordial pain  Elevated troponin    ED Discharge Orders     None          Geraldene Hamilton, MD 02/07/24 1132    Geraldene Hamilton, MD 02/07/24  1304    Geraldene Hamilton, MD 02/07/24 1442    Geraldene Hamilton, MD 02/07/24 1511

## 2024-02-08 DIAGNOSIS — F32A Depression, unspecified: Secondary | ICD-10-CM | POA: Diagnosis not present

## 2024-02-08 DIAGNOSIS — F419 Anxiety disorder, unspecified: Secondary | ICD-10-CM | POA: Diagnosis not present

## 2024-02-08 DIAGNOSIS — I2510 Atherosclerotic heart disease of native coronary artery without angina pectoris: Secondary | ICD-10-CM | POA: Diagnosis not present

## 2024-02-08 DIAGNOSIS — Z888 Allergy status to other drugs, medicaments and biological substances status: Secondary | ICD-10-CM | POA: Diagnosis not present

## 2024-02-08 DIAGNOSIS — Z87891 Personal history of nicotine dependence: Secondary | ICD-10-CM | POA: Diagnosis not present

## 2024-02-08 DIAGNOSIS — K219 Gastro-esophageal reflux disease without esophagitis: Secondary | ICD-10-CM | POA: Diagnosis not present

## 2024-02-08 DIAGNOSIS — N529 Male erectile dysfunction, unspecified: Secondary | ICD-10-CM | POA: Diagnosis not present

## 2024-02-08 DIAGNOSIS — Z7951 Long term (current) use of inhaled steroids: Secondary | ICD-10-CM | POA: Diagnosis not present

## 2024-02-08 DIAGNOSIS — Z8249 Family history of ischemic heart disease and other diseases of the circulatory system: Secondary | ICD-10-CM | POA: Diagnosis not present

## 2024-02-08 DIAGNOSIS — I10 Essential (primary) hypertension: Secondary | ICD-10-CM | POA: Diagnosis not present

## 2024-02-08 DIAGNOSIS — D72829 Elevated white blood cell count, unspecified: Secondary | ICD-10-CM | POA: Diagnosis not present

## 2024-02-08 DIAGNOSIS — I214 Non-ST elevation (NSTEMI) myocardial infarction: Secondary | ICD-10-CM

## 2024-02-08 DIAGNOSIS — E66811 Obesity, class 1: Secondary | ICD-10-CM | POA: Diagnosis not present

## 2024-02-08 DIAGNOSIS — N179 Acute kidney failure, unspecified: Secondary | ICD-10-CM | POA: Diagnosis not present

## 2024-02-08 DIAGNOSIS — K7400 Hepatic fibrosis, unspecified: Secondary | ICD-10-CM | POA: Diagnosis not present

## 2024-02-08 DIAGNOSIS — I160 Hypertensive urgency: Secondary | ICD-10-CM | POA: Diagnosis not present

## 2024-02-08 DIAGNOSIS — R7989 Other specified abnormal findings of blood chemistry: Secondary | ICD-10-CM | POA: Diagnosis not present

## 2024-02-08 DIAGNOSIS — G4700 Insomnia, unspecified: Secondary | ICD-10-CM | POA: Diagnosis not present

## 2024-02-08 DIAGNOSIS — R079 Chest pain, unspecified: Secondary | ICD-10-CM | POA: Diagnosis not present

## 2024-02-08 DIAGNOSIS — N400 Enlarged prostate without lower urinary tract symptoms: Secondary | ICD-10-CM | POA: Diagnosis not present

## 2024-02-08 DIAGNOSIS — R072 Precordial pain: Secondary | ICD-10-CM | POA: Diagnosis not present

## 2024-02-08 DIAGNOSIS — Z79899 Other long term (current) drug therapy: Secondary | ICD-10-CM | POA: Diagnosis not present

## 2024-02-08 DIAGNOSIS — Z6834 Body mass index (BMI) 34.0-34.9, adult: Secondary | ICD-10-CM | POA: Diagnosis not present

## 2024-02-08 DIAGNOSIS — R778 Other specified abnormalities of plasma proteins: Secondary | ICD-10-CM | POA: Diagnosis not present

## 2024-02-08 DIAGNOSIS — E785 Hyperlipidemia, unspecified: Secondary | ICD-10-CM | POA: Diagnosis not present

## 2024-02-08 LAB — TROPONIN I (HIGH SENSITIVITY): Troponin I (High Sensitivity): 689 ng/L (ref <18)

## 2024-02-08 LAB — HEPATIC FUNCTION PANEL
ALT: 29 U/L (ref 0–44)
AST: 35 U/L (ref 15–41)
Albumin: 3.7 g/dL (ref 3.5–5.0)
Alkaline Phosphatase: 50 U/L (ref 38–126)
Bilirubin, Direct: 0.2 mg/dL (ref 0.0–0.2)
Indirect Bilirubin: 0.8 mg/dL (ref 0.3–0.9)
Total Bilirubin: 1 mg/dL (ref 0.0–1.2)
Total Protein: 7.1 g/dL (ref 6.5–8.1)

## 2024-02-08 LAB — T4, FREE: Free T4: 0.77 ng/dL (ref 0.61–1.12)

## 2024-02-08 LAB — HEMOGLOBIN A1C
Hgb A1c MFr Bld: 5.7 % — ABNORMAL HIGH (ref 4.8–5.6)
Mean Plasma Glucose: 116.89 mg/dL

## 2024-02-08 LAB — MRSA NEXT GEN BY PCR, NASAL: MRSA by PCR Next Gen: NOT DETECTED

## 2024-02-08 LAB — TSH: TSH: 1.377 u[IU]/mL (ref 0.350–4.500)

## 2024-02-08 LAB — ETHANOL: Alcohol, Ethyl (B): <15 mg/dL (ref <15)

## 2024-02-08 LAB — D-DIMER, QUANTITATIVE: D-Dimer, Quant: <0.27 ug{FEU}/mL (ref 0.00–0.50)

## 2024-02-08 MED ORDER — ROSUVASTATIN CALCIUM 20 MG PO TABS
40.0000 mg | ORAL_TABLET | Freq: Every day | ORAL | Status: DC
  Administered 2024-02-09 – 2024-02-10 (×2): 40 mg via ORAL
  Filled 2024-02-08 (×2): qty 2

## 2024-02-08 MED ORDER — SODIUM CHLORIDE 0.9 % WEIGHT BASED INFUSION
1.0000 mL/kg/h | INTRAVENOUS | Status: DC
  Administered 2024-02-09: 1 mL/kg/h via INTRAVENOUS

## 2024-02-08 MED ORDER — ASPIRIN 81 MG PO TBEC
81.0000 mg | DELAYED_RELEASE_TABLET | Freq: Every day | ORAL | Status: DC
  Administered 2024-02-08 – 2024-02-10 (×2): 81 mg via ORAL
  Filled 2024-02-08 (×3): qty 1

## 2024-02-08 MED ORDER — ACETAMINOPHEN 325 MG PO TABS: 650.0000 mg | ORAL_TABLET | ORAL | Status: DC | PRN

## 2024-02-08 MED ORDER — HYDRALAZINE HCL 20 MG/ML IJ SOLN: 10.0000 mg | INTRAMUSCULAR | Status: DC | PRN

## 2024-02-08 MED ORDER — ALBUTEROL SULFATE (2.5 MG/3ML) 0.083% IN NEBU
2.5000 mg | INHALATION_SOLUTION | RESPIRATORY_TRACT | Status: DC | PRN
Start: 2024-02-08 — End: 2024-02-10

## 2024-02-08 MED ORDER — ONDANSETRON HCL 4 MG/2ML IJ SOLN: 4.0000 mg | Freq: Four times a day (QID) | INTRAMUSCULAR | Status: DC | PRN

## 2024-02-08 MED ORDER — PAROXETINE HCL 10 MG PO TABS
10.0000 mg | ORAL_TABLET | Freq: Every day | ORAL | Status: DC
  Administered 2024-02-08 – 2024-02-10 (×3): 10 mg via ORAL
  Filled 2024-02-08 (×4): qty 1

## 2024-02-08 MED ORDER — SODIUM CHLORIDE 0.9 % WEIGHT BASED INFUSION
3.0000 mL/kg/h | INTRAVENOUS | Status: DC
  Administered 2024-02-09: 3 mL/kg/h via INTRAVENOUS

## 2024-02-08 MED ORDER — HEPARIN (PORCINE) 25000 UT/250ML-% IV SOLN
900.0000 [IU]/h | INTRAVENOUS | Status: DC
  Administered 2024-02-08: 900 [IU]/h via INTRAVENOUS
  Filled 2024-02-08: qty 250

## 2024-02-08 MED ORDER — NITROGLYCERIN IN D5W 200-5 MCG/ML-% IV SOLN
0.0000 ug/min | INTRAVENOUS | Status: DC
  Administered 2024-02-08: 5 ug/min via INTRAVENOUS
  Filled 2024-02-08: qty 250

## 2024-02-08 MED ORDER — MELATONIN 5 MG PO TABS
5.0000 mg | ORAL_TABLET | Freq: Every evening | ORAL | Status: DC | PRN
  Administered 2024-02-08 – 2024-02-09 (×2): 5 mg via ORAL
  Filled 2024-02-08 (×2): qty 1

## 2024-02-08 MED ORDER — FLUTICASONE FUROATE-VILANTEROL 100-25 MCG/ACT IN AEPB
1.0000 | INHALATION_SPRAY | Freq: Every day | RESPIRATORY_TRACT | Status: DC
  Administered 2024-02-10: 1 via RESPIRATORY_TRACT
  Filled 2024-02-08: qty 28

## 2024-02-08 MED ORDER — ASPIRIN 81 MG PO CHEW
81.0000 mg | CHEWABLE_TABLET | ORAL | Status: AC
  Administered 2024-02-09: 81 mg via ORAL
  Filled 2024-02-08: qty 1

## 2024-02-08 MED ORDER — HEPARIN BOLUS VIA INFUSION
4000.0000 [IU] | Freq: Once | INTRAVENOUS | Status: AC
  Administered 2024-02-08: 4000 [IU] via INTRAVENOUS
  Filled 2024-02-08: qty 4000

## 2024-02-08 MED ORDER — LORAZEPAM 2 MG/ML IJ SOLN
0.5000 mg | Freq: Four times a day (QID) | INTRAMUSCULAR | Status: DC | PRN
  Administered 2024-02-08: 0.5 mg via INTRAVENOUS
  Filled 2024-02-08: qty 1

## 2024-02-08 NOTE — Plan of Care (Signed)

## 2024-02-08 NOTE — ED Provider Notes (Signed)
 Patient here awaiting bed assignment at [facility].  He remains chest pain-free.  He is requesting to eat.  I discussed with Dr. Sheena with cardiology who could not inform me whether or not patient may be going for a catheterization today.  She recommends starting heparin if the patient has any ongoing chest pain.  He has been chest pain-free so we will hold off on heparin at this point.   Lenor Hollering, MD 02/08/24 [phone]

## 2024-02-08 NOTE — TOC CM/SW Note (Signed)
 Transition of Care [facility]) - Inpatient Brief Assessment   Patient Details  Name: Guthrie Lemme MRN: [id number] Date of Birth: July 22, 1950  [facility] of [facility] CM/SW Contact:    Lauraine FORBES Saa, LCSWA Phone Number: 02/08/2024, 3:25 PM   Clinical Narrative:  3:25 PM Per chart review, patient resides at home with spouse. Patient has a PCP and insurance. Patient does not have SNF/HH/DME history. Patient's preferred pharmacy's are [facility] [phone] and [facility]. CSW provided SDOH (food) resources. No other TOC needs identified at this time. TOC will continue to follow.  Transition of Care Asessment: Insurance and Status: Insurance coverage has been reviewed Patient has primary care physician: Yes Home environment has been reviewed: Private Residence Prior level of function:: N/A Prior/Current Home Services: No current home services Social Drivers of Health Review: SDOH reviewed interventions complete Readmission risk has been reviewed: Yes (Currently Green 9%) Transition of care needs: no transition of care needs at this time

## 2024-02-08 NOTE — Progress Notes (Signed)
 PHARMACY - ANTICOAGULATION CONSULT NOTE  Pharmacy Consult:  Heparin Indication: chest pain/ACS  Allergies  Allergen Reactions   Simvastatin Itching   Montelukast Rash    Patient Measurements: Height: 5' 1 (154.9 cm) Weight: 83.8 kg (184 lb 11.9 oz) IBW/kg (Calculated) : 52.3 HEPARIN DW (KG): 70.9  Vital Signs: Temp: 98 F (36.7 C) (11/04 1340) Temp Source: Oral (11/04 1340) BP: 169/84 (11/04 1340) Pulse Rate: 88 (11/04 1340)  Labs: Recent Labs    02/07/24 0952 02/08/24 1348  HGB 14.3  --   HCT 43.6  --   PLT 169  --   CREATININE 1.29*  --   TROPONINIHS  --  689*    Estimated Creatinine Clearance: 45.4 mL/min (A) (by C-G formula based on SCr of 1.29 mg/dL (H)).   Medical History: Past Medical History:  Diagnosis Date   CAD in native artery 03/05/2022   Hyperlipidemia    Hypertension    Murmur 09/03/2022   Prediabetes    White coat syndrome with diagnosis of hypertension 01/26/2022     Assessment: 73 YOM presented to [facility] with chest pain and then transferred to [facility] for cath.  Pharmacy consulted to dose IV heparin.  Patient is not on a blood thinner PTA per med history.  Baseline labs reviewed.  Goal of Therapy:  Heparin level 0.3-0.7 units/ml Monitor platelets by anticoagulation protocol: Yes   Plan:  Heparin 4000 units IV bolus, then  Start heparin gtt at 900 units/hr Check 8 hr heparin level Daily heparin level and CBC F/u with plan for cath  Charmin Aguiniga D. Lendell, PharmD, BCPS, BCCCP 02/08/2024, 3:43 PM

## 2024-02-08 NOTE — Plan of Care (Signed)
  Problem: Education: Goal: Understanding of cardiac disease, CV risk reduction, and recovery process will improve Outcome: Progressing Goal: Individualized Educational Video(s) Outcome: Progressing   Problem: Activity: Goal: Ability to tolerate increased activity will improve Outcome: Progressing   Problem: Cardiac: Goal: Ability to achieve and maintain adequate cardiovascular perfusion will improve Outcome: Progressing   Problem: Health Behavior/Discharge Planning: Goal: Ability to safely manage health-related needs after discharge will improve Outcome: Progressing   Problem: Education: Goal: Knowledge of General Education information will improve Description: Including pain rating scale, medication(s)/side effects and non-pharmacologic comfort measures Outcome: Progressing   Problem: Health Behavior/Discharge Planning: Goal: Ability to manage health-related needs will improve Outcome: Progressing   Problem: Clinical Measurements: Goal: Ability to maintain clinical measurements within normal limits will improve Outcome: Progressing Goal: Will remain free from infection Outcome: Progressing Goal: Diagnostic test results will improve Outcome: Progressing Goal: Respiratory complications will improve Outcome: Progressing Goal: Cardiovascular complication will be avoided Outcome: Progressing   Problem: Activity: Goal: Risk for activity intolerance will decrease Outcome: Progressing   Problem: Nutrition: Goal: Adequate nutrition will be maintained Outcome: Progressing   Problem: Coping: Goal: Level of anxiety will decrease Outcome: Progressing   Problem: Elimination: Goal: Will not experience complications related to bowel motility Outcome: Progressing Goal: Will not experience complications related to urinary retention Outcome: Progressing   Problem: Pain Managment: Goal: General experience of comfort will improve and/or be controlled Outcome: Progressing    Problem: Safety: Goal: Ability to remain free from injury will improve Outcome: Progressing   Problem: Skin Integrity: Goal: Risk for impaired skin integrity will decrease Outcome: Progressing   Problem: Education: Goal: Understanding of CV disease, CV risk reduction, and recovery process will improve Outcome: Progressing Goal: Individualized Educational Video(s) Outcome: Progressing   Problem: Activity: Goal: Ability to return to baseline activity level will improve Outcome: Progressing   Problem: Cardiovascular: Goal: Ability to achieve and maintain adequate cardiovascular perfusion will improve Outcome: Progressing Goal: Vascular access site(s) Level 0-1 will be maintained Outcome: Progressing

## 2024-02-08 NOTE — Progress Notes (Signed)
 [facility] night cross cover note:   I've order prn melatonin for insomnia for this patient. additionally, patient complaining of anxiety leading up to his cardiac cath scheduled for tomorrow.  I subsequently ordered prn IV Ativan for anxiety for him.    Eva Pore, DO Hospitalist

## 2024-02-08 NOTE — ED Notes (Signed)
 [facility] called.

## 2024-02-08 NOTE — ED Notes (Signed)
 Report given and paperwork completed by prev RN.

## 2024-02-08 NOTE — ED Notes (Signed)
 Provider informed of the time frame since the second trop and they were asked if a third was required... They stated that the cardiologist did not think a third was required due to the second decreasing.[id number]

## 2024-02-08 NOTE — Discharge Instructions (Signed)
 [place] [facility] Address: [address] [place], [place] [address] Phone Number: [phone] Hours of Operation: Residents of [place] can come to obtain food Monday through Friday from 8:30am until 3:30pm. Photo ID and Social Security cards required for all residents of a household. Can come six times a year  [facility] Address: [address] [place], [place] [address] Phone Number: [phone] Hours of Operation: Operates Tuesday-Friday 10:00 a.m. to 1 p.m. Requirements: Referral from DSS needed. May come 6 times a year, 30 days apart. Photo ID and SS required for all residents of household.  [facility] Address: [address] [place], [place] [address] Phone Number: [phone] Hours of Operation: Food pantry is open on the last Saturday of each month from 10:00 am - 12:00 noon. No appointment needed. No qualifications.  [facility] Address: [address] [place], [place] [address] Phone Number: [phone]. [phone] Hours of Operation: Must make reservations to pick up food on Saturdays. Sign ups for Saturday pick up beginning at 8:30 a.m. on Monday morning.  [facility] Address: [address] [place], [place] [address] Phone Number: [phone] Hours of Operation: If you need food, bring proper identification such as a driver's license to receive a bag of food once a month. Requirements: Can come once every 30 days with referral DSS, Holiday representative, Mental health etc. Each referral good for six visits. Photo ID required. *1st visit no referral required.  [facility] Address: [address], [place] [address] Phone Number: [phone]  [facility] Address: [address] [place], [place] [address] Phone Number: [phone] Hours of Operation:  You can register at [email or web address] for free groceries  FREE INDEED FOOD  P[facility]TRY Address: [address], [place] [address] Phone Number: [phone] Hours of Operation: Drive through giveaway, first come first served. Every 3rd Saturday 11AM - 1PM  [facility] [address] Address: [address], [place] [address] Phone Number: [phone]   [place]  [facility] Address: [address] [place], [place] [address] Phone Number: [phone] Hours of Operation: Once a month every 3rd Saturday  [facility] Address: [address] [place], [place] [address] Phone Number: [phone] Hours of Operation: Distribution happens from 9:00-10:00 a.m. every Saturday.     [facility] Address: [address] [place], [place] [address] Phone Number: [phone] Hours of Operation: ONCE a week for the community food distribution held every Tuesday, Wednesday and Thursday from 11 a.m. - 2:00 p.m. Food is available on a first come, first serve basis and varies week to week. No appointment necessary for drive thru pick up.  [facility] Address: [address] [place], [place] [address] Phone Number: [phone] Hours of Operation: Open every 3rd Thursday 9:30 a.m. - 11:00 a.m.  [facility] Address: [address] [place], [place] [address] Phone Number: [phone] Hours of Operation: Please call for hours, directions, and questions  [facility] [place] [facility] Address: [address], [place], [place]  [address] Phone Number: [phone] Website: https://www.Hollyguns.co.za Food Finder app: https://findfood.ghpfa.org  [employer] Address: [address] [place], [place] [address] Phone Number: [phone] Hours of Operation: Contact Bree Harpe. Enrolled Substance Abuse Clients Only  [facility] Address: [address] [place], [address]  Phone Number: [phone] Hours of Operation: Contact Alene Ana. Food pantry open the 3rd Saturday of each month  from 9 a.m. -12 p.m. only  [place] [facility] Address: [address] [place], [place] [address] Phone Number: [phone] Hours of Operation: Contact Loletta Ripple. Emergency food bank open on Saturdays by appointment only  [facility] Address: [address] [place], [place] [address] Phone Number: [phone] Hours of Operation: No specific contact person; Anyone can help  [facility] Address: [address] [place], [place] [address] Phone Number: [phone] Hours of Operation: Contact Julia Oats. Agency gives out a bag of food every Thursday from 2-4 p.m. only, and also provides a community meal every Thursday between 5-6 p.m. Other services provided include rent/mortgage and utility assistance, women's winter shelter, thrift store, and senior adult activities.  [facility] [place] Address: [address] [place], [place] [address] Phone Number: [phone] Hours of Operation: The Emergency Food Assistance Program provides individuals and families with a generous supply of food including meat, fresh vegetables, and nonperishable items. The food box contains five days' worth of food, and each family or individual can receive a box once per month. M, W, Th, Fr 11am-2pm, walk-ins welcome.  [facility] Address: [address] [place], [place] [address]  Phone Number: [phone] Hours of Operation: Contact Asia Blanca Bunch. Tuesdays and Thursdays from 11am - 3pm by appointment only  Sunday, by APPOINTMENT ONLY  [address] of [place], [address] [place], [address], [phone], 3.2 mi from [facility], call in advance for appointment at 10:00am or at 4:00pm, must provide valid photo ID  Monday  9:30am-5:00pm [facility], [address] [place] [id number], [phone], 0.9 mi from [facility], can come four times per year, bring your photo ID and SS cards for other  residents of household, will make appointments for those who work and need to come after 5pm  10:00am-12:00noon [employer], [address]  [place], [address], [phone], 1.7 mi from [facility], can come once every 60 days per household, need referral from DSS, [employer] Global, etc., bring photo ID and SS card   10:00am-1:00pm [employer] [facility],  [address] [place], [address], [phone], 7.7 mi from [facility], can come once every thirty days with a referral from DSS, [employer], Mental Health, etc. -- each referral good for six visits, bring photo ID   10:00am-1:00pm [address], [address], [address], [phone], 4.2 mi from [facility], can come once every 6 months, open to [facility] residents, bring photo ID and copy of a current utility bill in your name, please call first to verify that food is available  6:30pm-8:30pm [facility], [address], [address], [phone], 3.2 mi from [facility], can come once every 30 days, maximum 6 times per year, bring your photo ID and SS numbers for other residents of household  Monday by APPOINTMENT ONLY  [facility], [address] [place], [address],  [phone], 2.5 mi from [facility], call in advance for appointment between 10:00am-2:00pm, bring your photo ID and SS cards for all residents of household, can come once every 3 months  [facility], [address], [address], [phone], 0.7 mi from [facility], call in advance for appointment, can come once every 30 days, bring your photo ID and SS cards for other residents of  household   Tuesday  9:00am-12:00noon [employer], [address] [address], [phone], 1.3 mi from [facility], can come once every 3 months, bring your photo ID and SS numbers for other residents of household   9:00am-1:00pm [facility] [address]   [place], [address], [phone]/Ext [phone], 1.6 mi from [facility], can come once every two weeks  9:30am-5:00pm [employer], [address] [place] [phone], [phone], 0.9 mi from [facility], can come four times per year, bring your photo ID and SS cards for other residents of household, will make appointments for those who work and need to come after 5pm  10:00am-12:00noon [employer], [address]  [place], [address], [phone], 1.7 mi from [facility], can come once every 60 days per household, need referral from DSS, [employer], etc., bring photo ID and SS card  10:00am-1:00pm [facility], [id number],  [phone], 3.8 mi from [facility], with referral from DSS, may come six times, 30 days apart, bring your photo ID and SS cards for all residents of household   10:00am-1:00pm [address] the [facility], [address] [place], [address], [phone], 7.7 mi from [facility], can come once every thirty days with a referral from DSS, [employer], Mental Health, etc.- each referral good for six visits, bring photo ID   10:00am-1:00pm [address], [address], [address], [phone], 4.2 mi from [facility], can come once every 6 months, open to [facility] residents, bring photo ID and copy of a current utility bill in your name, please call first to verify that food is available  2:00pm-3:30pm [facility], [address], [phone], [phone], 3.7 mi from [facility], can come twelve times per year, one bag per family, bring photo ID   FIRST AND THIRD Tuesdays  10:00am-1:00pm, [address] [place], [address], [phone], 7.2 mi from [facility], can come once every 30 days   Tuesday, WHEN FOOD IS AVAILABLE (call)  12:00noon-2:00pm [facility], [address], [address], [phone], 1.5 mi from  [facility], can come once every 30 days, bring photo ID   Tuesday, by APPOINTMENT ONLY  [facility], [address] [place], [address],  [phone], 2.5 mi from [facility], call in advance for appointment between 1:00pm-4:00pm, bring your photo ID and SS cards for all residents of household, can come once every 3 months   [address] of [place], [address], [address], [phone], 5 mi from [facility], call one day ahead for appointment the next day between 10:00am and 12:00noon, can come once every 3 months, bring your photo ID and must qualify according to family income   [facility], [address], [address], [phone], 0.7 mi from [facility], call in advance for appointment, can come once every 30 days, bring your photo ID and SS cards for other residents of household   [address] of [place], Arsenio Larger [place], [address],  [phone], 5.1 mi from [facility], call between 9:00am and 1:00pm M-F to make appointment. Appointments are scheduled for Tues and Thurs from 10:00am-11:30am, bring photo ID, can come once every 6 months, limit three visits over  18 months, then must have referral  Wednesday  9:30am-5:00pm [facility], [address] [place] [phone], [phone], 0.9 mi from [facility], can come four times per year, bring your photo ID and SS cards for other residents of household, will make appointments for those who work and need to come after 5pm  9:30am-11:30am [employer] [facility], [address], [address], [phone], 6.6 mi from [facility], can come once every 30 days, bring your photo ID, and SS cards for other residents of household, each monthly visit requires a written referral from GUM or DSS with number in household on form  10:00am-12:00noon [address], [address] [place], [address], [phone], 1.7 mi from [facility], can come once every 60 days per household, need referral from DSS, [employer], etc., bring photo ID and SS card  10:00am-1:00pm [facility], [id number],  [phone], 3.9 mi from [facility], with referral from DSS, may come six times, 30 days apart, bring your photo ID and SS cards for all residents of household   10:00am-1:00pm [address] the [facility], [address] [place], [address], [phone], 7.7 mi from [facility], can come once every thirty days with a referral from DSS, [employer], Mental Health, etc. - each referral good for six visits, bring photo ID   2:00pm-5:45pm [address] [place], [address], [phone], 3.2 mi from [facility], once every 30 days, first come/first served, limited to first 25, bring photo ID   6:30pm-8:30pm [facility], [address], [address], [phone], 3.2 mi from [facility], can come once every 30 days, maximum 6 times per year, bring your photo ID and SS numbers for other residents of household  FIRST and THIRD Wednesdays   9:00am-12:00noon, [facility], [address] [place], [address], [phone], 4.2 mi from [facility], can come once a month. Please arrive and sign in no later than 11:15 so everyone can be served by 12 noon.  THIRD Wednesday  1:30pm-3:00pm, [place]. [address], [address] [place], [address], [phone] or [phone], 2.1 mi from [facility]  Wednesday, by APPOINTMENT ONLY  [address] [facility] of [place], [address] [address], [phone], 5 mi from [facility], call one day ahead for appointment the next day between 10:00am and 12:00noon, can come once every 3 months, bring your photo ID and must qualify according to family income   [facility], [address] [address], [phone], 0.7 mi from [facility], call in advance for  appointment, can come once every 30 days, bring your photo ID and SS cards for other residents of household   [address] of [place], [address] [place], [address], [phone], 3.2 mi from [facility], call in advance for appointment at 7:00pm, must provide valid photo ID  Thursday  9:00am-12:00noon [employer], [address], [address], [phone], 1.3 mi from [facility], can come once every 3 months, bring your photo ID and SS numbers for other residents of household   9:00am-1:00pm [facility] [address],  [place], [address], [phone]/Ext [phone], 1.6 mi from [facility], can come once every two weeks  9:30am-12:00noon [facility], [address] [place], [phone], 4.5 mi from [facility], can come once every 30 days, must state income [closed Thanksgiving and week of Christmas]  9:30am-5:00pm [employer], [address] [place] [phone], [phone], 0.9 mi from [facility], can come four times per year, bring your photo ID and SS card for other residents of household, will make appointments for those who work and need to come after 5pm  10:00am-1:00pm [facility], [address] [place], [id number],  [phone], 3.9 mi from [facility], with referral from DSS, may come six times, 30 days apart, bring your photo ID and SS cards for all residents of household   10:00am-1:00pm [address] the [facility], [address] [place], [address], [phone], 7.7 mi from [facility], can come once every thirty days with a referral from DSS, [employer], Mental Health, etc.- each referral good for six visits, bring photo ID   10:00am-1:00pm [facility], [address] [address], [phone], 4.2 mi from [facility], can come once every 6 months, open to [facility] residents, bring photo ID and copy of a current utility bill in your name, please call first to  verify that food is available   SUNDAYS BREAKFAST TWO LOCATIONS: 8:00am served in [facility] by Awaken PPL Corporation 8:30am SHUTTLE provided from [facility], served at [employer], [address] LUNCH TWO LOCATIONS [plus one additional third Sunday only] 10:30am - 12:30pm served at [employer], [employer], [place] [address] (1.2 miles from [facility]) 12:30pm served in [place] by Land O'Lakes Team (THIRD Sunday only) 1:30pm served at [facility] by [facility] one location [plus one additional third Sunday only] 5:00pm Every Sunday, served under the bridge at [address] by Lige Reeve Under the 3M Company (.7 miles from [facility]) (THIRD Sunday ONLY) 4:00pm served in the parking garage, across from Nucor Corporation, corner of Kimberly and [place] by Ryland Group Works Ministries MONDAYS BREAKFAST 7:30am served in [employer] by the United States [employer] Corporation and Friends LUNCH 10:30am - 12:30pm served at [employer], [employer], [place] [address] (1.2 miles from [facility]) DINNER TWO LOCATIONS: 7:00pm served in front of the courthouse at the corner of Goldman Sachs and Qwest Communications. by [place] Monday Night Meal (3 blocks from [facility]) 4:30pm served at the [employer]ation, [address] by Bank of New York Company (0.6 miles from [facility]) TUESDAYS BREAKFAST 8:00am - 9:00am served at The TJX Companies, [address] (0.3 miles from [place]) LUNCH 10:30am - 12:30pm served at the E[employer], [employer] [address], (1.2 miles from [place]) DINNER 6:00pm served at CSX Corporation, enter from Capital One and go to the Sonic Automotive, (0.7 miles from [place]) [facility] BREAKFAST 7:00am - 8:00am served at [employer], [employer] [address], (1.2 miles from [place]) LUNCH ONE LOCATION [plus two additional locations listed below] 10:30am - 12:30pm served at [employer], [employer] [address] (1.2 miles from [place]) (FIRST Wednesday ONLY) 11:30am served at Dillard's, [place] [address] (6.6 miles from [place]) (SECOND Wednesday ONLY) 11:00am served at [place]. [place] of [address], [address] (1.3 miles from [place]) [place] TWO  LOCATIONS 6:00pm served at W. R. Berkley, [place] W. Visteon Corporation. (1.3 miles from [facility]) 4:00pm - 6:00pm (hot dogs and chips) served at Levi Strauss of [place] [facility], [address] (1.7 miles from [place]) [place] BREAKFAST NOT AVAILABLE AT THIS TIME LUNCH 10:30am - 12:30pm served at Ecol[employer], Liberty [employer], [place] [address] (1.2 miles from [place]) DINNER 6:00pm served at CSX Corporation, enter from Capital One and go to the Sonic Automotive, (0.7 miles from Nucor Corporation) [place] BREAKFAST NOT AVAILABLE AT THIS TIME LUNCH 10:30am - 12:30pm served at [employer], [employer] [address] (1.2 miles from [place]) DINNER TWO LOCATIONS, [plus one additional first Friday only] 6:00pm served under the bridge at [address] by Lige Reeve Under CSX Corporation. (.7 miles from [facility]) 5:00pm - 7:00pm served at Levi Strauss of [facility], [address] (1.7 miles from [place]) (FIRST Friday ONLY) 5:45 pm - SHUTTLE provided from the LIBRARY at 5:45pm. Served at [facility], [address] [place]. SATURDAYS BREAKFAST TWO LOCATIONS [plus one additional last Saturday only] 8:00am served at [facility] by Delphi 8:30am served at [employer], [address] (2.2 miles from [facility]) (LAST  Saturday ONLY) 8:30am served at Beazer Homes, [address] (5 miles from [place]) LUNCH 10:30am - 12:30pm served at Ecol[employer], [employer] [address] (1.2 miles from [facility]) DINNER 6:00pm served under the bridge at [address] by World Fuel Services Corporation (0.7 miles from Nucor Corporation)  DIRECTIONS FROM [place] TO ALL MEAL LOCATIONS The Bridge at [address] (.7 miles from [address]) [address] on [place]. Turn Right onto DIRECTV 433 ft. Continue onto [address] under bridge, about 500 ft. Courthouse (3 blocks from [facility]) [place] on [address]. Turn right on [place]  1 block to PPL Corporation (.5 miles from [place]) [place] on [place]. YRC Worldwide. past Brink's Company to EMCOR. Enter from Capital One and go to the Affiliated Computer Services building W. R. Berkley [address] Visteon [address]. (1.3 miles from [facility]) [address] on [place]. Turn Right onto W. Melanie Spires. church will be on the Left. The TJX Companies [address] (.3 miles from [facility]) Go .3 miles on W. Friend[address] Destination is on your right Dillard's at ONEOK (6.6 miles from [address]) [address] on [place] toward W Friendly Turn right onto W Friendly Continue onto Alcoa Inc. Continue onto Toll Brothers. 5. Latina Pol is on right Sanmina-SCI Futures trader) [address] (.6 miles from [place]) [place] on [place]. [address] Turn Left onto [address] 0.3 miles Destination is on the Left. [place] Black & Decker at American Express (5 miles from Nucor Corporation) 1. Head south on [address]. Turn right onto [address] Friend[address] Turn slightly left onto Quest Diagnostics Continue onto Quest Diagnostics Turn right at Barnes & Noble Continue to church on right [place] Sounds of [facility] [address] (1.7 miles from [place]) [address] on [place] 1.4 miles College becomes [place]. Elm  Jeryl Moris. Continue 0.6 miles and church will be on theright. [place] Guardian Life Insurance at [address] (2.5 miles from Nucor Corporation) [place] provided from Massachusetts Mutual Life [place]] [place] on [place]. Elm toward Estée Lauder right onto Costco Wholesale left onto Emerson Electric Turn left onto Micron Technology [address] [place]  [place] (2.2 miles from [address]) [address] on [place] 1.4 miles [place] becomes [place]. [place] [address] Turn right onto [address] [place]  [address] and church will be on the Left. Potter's House/Dodge AT&T [address] (1.2 miles from [facility]) 1.Turn right onto [facility] 2.Turn left onto [place] 3.[place] 4.Destination is on your right [place]. Lind[place] Rh[place] of [address] at ToysRus (1.3 miles from [facility]) [address] on [address] Turn left onto Genuine Parts right onto S. [place]. Continue onto KB Home	[place]. Turn left onto Smurfit-Stone Container. Turn right onto WellPoint.

## 2024-02-08 NOTE — H&P (Signed)
 History and Physical    Patient: Shane Cabrera FMW:[id number] DOB: 02-18-1951 DOA: 02/07/2024 DOS: the patient was seen and examined on 02/08/2024 . PCP: Dayna Motto, DO  Patient coming from: [facility]. Chief complaint: Chief Complaint  Patient presents with   Chest Pain   HPI:  Shane Cabrera is a 73 y.o. male with past medical history  HTN, HLD, GERD, ED, BPH, hepatic fibrosis, depression/anxiety, p/w from [facility] w/ 2 days of intermittent chest pain.  Patient reports the chest pain has been precordial left-sided nonradiating pressure nonreproducible intermittent over the past 2 days.  No chest pain at bedside.  Noted trop elevation.  Ran by cardiology on call, said to come in for [facility].   ED Course:  Vital signs in the ED were notable for the following:  Vitals:   02/08/24 0857 02/08/24 1005 02/08/24 1030 02/08/24 1159  BP: (!) 152/75 139/89 (!) 154/72 (!) 157/95  Pulse: 89 80 87 93  Temp: 97.8 F (36.6 C)     Resp: (!) 24 (!) 22 20 20   Weight:      SpO2: 97% 98% 98% 100%  TempSrc: Oral   Oral   >>ED evaluation thus far shows: Initial BMP shows glucose of 132 AKI with a creatinine of 1.29 and eGFR of 58 calcium  10.5 LFTs added on and pending. Troponin of 379 repeat 335 EKG shows sinus rhythm at 78 PR 176 QTc of 392 QRS of 90 wandering baseline in inferior leads, and nonspecific T wave inversions in again inferior leads. Chart review shows patient had echo in August 2024 showing EF of 50 to 55% and grade 1 diastolic dysfunction.   >>While in the ED patient received the following: Medications  aspirin  chewable tablet 324 mg (324 mg Oral Given 02/07/24 1143)   Review of Systems  Cardiovascular:  Positive for chest pain.  All other systems reviewed and are negative.  Past Medical History:  Diagnosis Date   CAD in native artery 03/05/2022   Hyperlipidemia    Hypertension    Murmur 09/03/2022   Prediabetes    White coat syndrome with diagnosis of hypertension  01/26/2022   Past Surgical History:  Procedure Laterality Date   COLONOSCOPY  2016   NASAL SEPTOPLASTY W/ TURBINOPLASTY Bilateral 07/09/2020   Procedure: NASAL SEPTOPLASTY WITH BILATERAL  TURBINATE REDUCTION;  Surgeon: Karis Clunes, MD;  Location: [facility];  Service: ENT;  Laterality: Bilateral;    reports that he has quit smoking. He has never used smokeless tobacco. He reports current alcohol use. He reports that he does not currently use drugs. Allergies  Allergen Reactions   Simvastatin Itching   Montelukast Rash   Family History  Problem Relation Age of Onset   Heart attack Mother    Hypertension Mother    Heart attack Sister    Heart attack Brother    Prior to Admission medications   Medication Sig Start Date End Date Taking? Authorizing Provider  albuterol (VENTOLIN HFA) 108 (90 Base) MCG/ACT inhaler Inhale 2 puffs into the lungs as needed for wheezing or shortness of breath. 07/27/22  Yes [provider]  budesonide-formoterol (SYMBICORT) 80-4.5 MCG/ACT inhaler Inhale 2 puffs into the lungs as needed. 07/17/22  Yes [provider]  diltiazem (DILACOR XR) 240 MG 24 hr capsule Take 480 mg by mouth daily.   Yes [provider]  fenofibrate 54 MG tablet Take 54 mg by mouth daily.   Yes [provider]  fluticasone (FLONASE) 50 MCG/ACT nasal spray Place into  both nostrils daily.   Yes [provider]  hydrochlorothiazide  (HYDRODIURIL ) 25 MG tablet Take 1 tablet (25 mg total) by mouth daily. 05/13/23  Yes Raford Riggs, MD  loratadine (CLARITIN) 10 MG tablet Take 10 mg by mouth daily.   Yes [provider]  omeprazole (PRILOSEC) 20 MG capsule Take 20 mg by mouth daily. 07/14/22  Yes [provider]  PARoxetine (PAXIL) 10 MG tablet Take 10 mg by mouth daily.   Yes [provider]  rosuvastatin  (CRESTOR ) 40 MG tablet TAKE 1 TABLET BY MOUTH DAILY 10/07/23  Yes Raford Riggs, MD  sildenafil  (VIAGRA ) 50  MG tablet Take 1 tablet (50 mg total) by mouth daily as needed for erectile dysfunction. 10/16/21  Yes Walker, Caitlin S, NP  spironolactone  (ALDACTONE ) 25 MG tablet TAKE 1 TABLET BY MOUTH DAILY 02/24/23  Yes Raford Riggs, MD  valsartan  (DIOVAN ) 320 MG tablet Take 1 tablet (320 mg total) by mouth daily. 06/24/23  Yes Raford Riggs, MD  tadalafil (CIALIS) 20 MG tablet Take 20 mg by mouth daily as needed for erectile dysfunction.    [provider]                                                                                 Vitals:   02/08/24 0857 02/08/24 1005 02/08/24 1030 02/08/24 1159  BP: (!) 152/75 139/89 (!) 154/72 (!) 157/95  Pulse: 89 80 87 93  Resp: (!) 24 (!) 22 20 20   Temp: 97.8 F (36.6 C)     TempSrc: Oral   Oral  SpO2: 97% 98% 98% 100%  Weight:       Physical Exam Vitals reviewed.  Constitutional:      General: He is not in acute distress.    Appearance: He is not ill-appearing.  HENT:     Head: Normocephalic.  Eyes:     Extraocular Movements: Extraocular movements intact.  Cardiovascular:     Rate and Rhythm: Normal rate and regular rhythm.     Heart sounds: Normal heart sounds.  Pulmonary:     Breath sounds: Normal breath sounds.  Abdominal:     General: There is no distension.     Palpations: Abdomen is soft.     Tenderness: There is no abdominal tenderness.  Neurological:     General: No focal deficit present.     Mental Status: He is alert and oriented to person, place, and time.     Labs on Admission: I have personally reviewed following labs and imaging studies CBC: Recent Labs  Lab 02/07/24 0952  WBC 16.1*  HGB 14.3  HCT 43.6  MCV 82.3  PLT 169   Basic Metabolic Panel: Recent Labs  Lab 02/07/24 0952  NA 138  K 4.0  CL 103  CO2 24  GLUCOSE 132*  BUN 17  CREATININE 1.29*  CALCIUM  10.5*   GFR: CrCl cannot be calculated (Unknown ideal weight.). Liver Function Tests: No results for input(s): AST, ALT, ALKPHOS,  BILITOT, PROT, ALBUMIN in the last 168 hours. No results for input(s): LIPASE, AMYLASE in the last 168 hours. No results for input(s): AMMONIA in the last 168 hours. Recent Labs    02/07/24 [phone]  BUN 17  CREATININE 1.29*    Cardiac Enzymes: No results for input(s): CKTOTAL, CKMB, CKMBINDEX, TROPONINI in the last 168 hours. BNP (last 3 results) No results for input(s): PROBNP in the last 8760 hours. HbA1C: No results for input(s): HGBA1C in the last 72 hours. CBG: No results for input(s): GLUCAP in the last 168 hours. Lipid Profile: No results for input(s): CHOL, HDL, LDLCALC, TRIG, CHOLHDL, LDLDIRECT in the last 72 hours. Thyroid  Function Tests: No results for input(s): TSH, T4TOTAL, FREET4, T3FREE, THYROIDAB in the last 72 hours. Anemia Panel: No results for input(s): VITAMINB12, FOLATE, FERRITIN, TIBC, IRON, RETICCTPCT in the last 72 hours. Urine analysis: No results found for: COLORURINE, APPEARANCEUR, LABSPEC, PHURINE, GLUCOSEU, HGBUR, BILIRUBINUR, KETONESUR, PROTEINUR, UROBILINOGEN, NITRITE, LEUKOCYTESUR Radiological Exams on Admission: DG Chest 2 View Result Date: 02/07/2024 EXAM: 2 VIEW(S) XRAY OF THE CHEST 02/07/2024 09:38:00 AM COMPARISON: PA and lateral radiographs of the chest dated 01/12/2017. CLINICAL HISTORY: cp FINDINGS: LUNGS AND PLEURA: No focal pulmonary opacity. No pulmonary edema. No pleural effusion. No pneumothorax. HEART AND MEDIASTINUM: No acute abnormality of the cardiac and mediastinal silhouettes. BONES AND SOFT TISSUES: No acute osseous abnormality. IMPRESSION: 1. No acute process. Electronically signed by: Evalene Coho MD 02/07/2024 10:09 AM EST RP Workstation: [id number]   Data Reviewed: Relevant notes from primary care and specialist visits, past discharge summaries as available in EHR, including Care Everywhere . Prior diagnostic testing as pertinent to current  admission diagnoses, Updated medications and problem lists for reconciliation .ED course, including vitals, labs, imaging, treatment and response to treatment,Triage notes, nursing and pharmacy notes and ED provider's notes.Notable results as noted in HPI.Discussed case with EDMD/ ED APP/ or Specialty MD on call and as needed.  Assessment & Plan    >> Chest pain/NSTEMI: Patient has a history of CAD and is followed by Dr. Raford cardiology as outpatient.  Patient had a coronary calcium  score of 1 in July 2023. Will continue with statin therapy and defer to cardiology for antiplatelet therapy with heparin gtt. Lipid panel, TFTs, alcohol level, UDS, dimer pending. Will obtain an A1c, repeat troponin is pending.  >> Essential hypertension: Vitals:   02/08/24 0330 02/08/24 0453 02/08/24 0500 02/08/24 0530  BP: 135/84 (!) 179/60 (!) 154/60 (!) 144/58   02/08/24 0600 02/08/24 0700 02/08/24 0800 02/08/24 0830  BP: 132/64 103/89 (!) 116/45 137/79   02/08/24 0857 02/08/24 1005 02/08/24 1030 02/08/24 1159  BP: (!) 152/75 139/89 (!) 154/72 (!) 157/95  Patient is on multiple antihypertensive agents and has blood pressure fluctuations along with hypercalcemia which is probably from his HCTZ. Patient also has AKI that is new with all of these factors I am hesitant to start HCTZ and valsartan .  Will definitely start patient on as needed hydralazine.  Currently have held PTA meds.  Will need close monitoring to restart all or most of them.  DVT prophylaxis:  SCDs Consults:  Cardiology  Advance Care Planning:    Code Status: Full Code   Family Communication:  None Disposition Plan:  Home Severity of Illness: The appropriate patient status for this patient is INPATIENT. Inpatient status is judged to be reasonable and necessary in order to provide the required intensity of service to ensure the patient's safety. The patient's presenting symptoms, physical exam findings, and initial radiographic and  laboratory data in the context of their chronic comorbidities is felt to place them at high risk for further clinical deterioration. Furthermore, it is not anticipated that the patient will be medically stable for  discharge from the hospital within 2 midnights of admission.   * I certify that at the point of admission it is my clinical judgment that the patient will require inpatient hospital care spanning beyond 2 midnights from the point of admission due to high intensity of service, high risk for further deterioration and high frequency of surveillance required.*  Unresulted Labs (From admission, onward)     Start     Ordered   02/09/24 0500  Lipid panel  Tomorrow morning,   R        02/08/24 1256   02/08/24 1325  Hepatic function panel  Add-on,   AD        02/08/24 1324   02/08/24 1256  Hemoglobin A1c  Add-on,   AD        02/08/24 1256   02/08/24 1254  TSH  Add-on,   AD        02/08/24 1256   02/08/24 1254  T4, free  Add-on,   AD        02/08/24 1256   02/08/24 1253  D-dimer, quantitative  ONCE - STAT,   STAT        02/08/24 1253   02/08/24 1253  Ethanol  Add-on,   AD        02/08/24 1253   02/08/24 1253  Rapid urine drug screen (hospital performed)  ONCE - STAT,   STAT        02/08/24 1253            Meds ordered this encounter  Medications   aspirin  chewable tablet 324 mg   albuterol (PROVENTIL) (2.5 MG/3ML) 0.083% nebulizer solution 2.5 mg   fluticasone furoate-vilanterol (BREO ELLIPTA) 100-25 MCG/ACT 1 puff   PARoxetine (PAXIL) tablet 10 mg   rosuvastatin  (CRESTOR ) tablet 40 mg   hydrALAZINE (APRESOLINE) injection 10 mg   acetaminophen  (TYLENOL ) tablet 650 mg   ondansetron  (ZOFRAN ) injection 4 mg     Orders Placed This Encounter  Procedures   DG Chest 2 View   Basic metabolic panel   CBC   D-dimer, quantitative   Ethanol   Rapid urine drug screen (hospital performed)   TSH   T4, free   Hemoglobin A1c   Lipid panel   Hepatic function panel   Diet Heart  Room service appropriate? Yes; Fluid consistency: Thin   Document Height and Actual Weight   Cardiac Monitoring - Continuous Indefinite   Patient has an active order for admit to inpatient/place in observation   Refer to Sidebar Report Mobility Protocol for Adult Inpatient   Apply Angina, Rule Out Myocardial Infarction Care Plan   Vital signs with O2 sat q4 hours x 24 hours, then q shift   RN may order Cardiology PRN Orders utilizing Cardiology PRN medications (through manage orders) for the following patient needs:   Mobility Protocol: No Restrictions   Full code   Inpatient consult to Cardiology Consult Timeframe: STAT - requires a response within one hour; STAT timeframe requires provider to provider communication, has the provider to provider communication been completed: Yes; Reason for Consult? 6522 int...   Consult to hospitalist   ED EKG   EKG 12-Lead (at 6am)   EKG   EKG   EKG   EKG   Admit to Inpatient (patient's expected length of stay will be greater than 2 midnights or inpatient only procedure)    Author: Mario LULLA Blanch, MD 12 pm- 8 pm. [facility]. 02/08/2024 2:07 PM Please note for any  communication after hours contact [facility] Assigned provider on call on Amion.

## 2024-02-08 NOTE — ED Notes (Signed)
 Natay with [facility] called for transport

## 2024-02-08 NOTE — ED Notes (Addendum)
 Attempted to called report to [facility], charge nurse and ambulance triage nurse not available. Secure messaged Ozell Ford, and he confirmed report.

## 2024-02-08 NOTE — Consult Note (Addendum)
 Cardiology Consultation  Patient ID: Shane Cabrera MRN: [id number]; DOB: 11-29-1950  Admit date: 02/07/2024 Date of Consult: 02/08/2024  PCP:  Dayna Motto, DO   [facility] Providers Cardiologist:  Annabella Scarce, MD     Patient Profile: Shane Cabrera is a 73 y.o. male with a hx of hypertension, hyperlipidemia, prediabetes, GERD, allergic rhinitis, ED, BPH, hepatic fibrosis, depression, and anxiety  who is being seen 02/08/2024 for the evaluation of chest pain at the request of Dr. Tobie.  History of Present Illness: Shane Cabrera has past medical history as listed above. He presented to [facility] ED 02/07/2024 complaining of chest pain x 2 days. It was determined that he would be transferred to [facility] for further workup and evaluation of chest pain.   Relevant workup in the ED/since admitted includes: BMP showed elevated creatinine at 1.29, previously noted to be 1.08, CBC showed leukocytosis at 16.1, troponin 379 ? 335.  CXR showed no acute process.  EKG showed sinus rhythm, HR 78, with nonspecific T wave abnormalities diffusely.  He is typically followed by Dr. Scarce as an outpatient.  Last seen January 2025 in the advanced hypertension clinic.  He reports that he had been struggling with labile hypertension for over 10 years.  His blood pressure remained elevated at home, suspected that this was possibly related to inconsistent exercise and diet as well as alcohol consumption.  Home meds included: diltiazem 480 mg daily, hydrochlorothiazide  25 mg daily, spironolactone  25 mg daily, valsartan  320 mg daily.  In regards to nonobstructive CAD/hyperlipidemia Home medications included: rosuvastatin  40 mg daily, fenofibrate 54 mg daily.  He underwent a coronary CTA July 2023 that showed: calcium  score 1 (13th percentile), minimal nonobstructive CAD.  Echocardiogram in August 2024 showed: LVEF 50 to 55%, G1 DD, normal RV systolic function, mild MR, normal IVC.  He was transferred  to [facility] from [facility] ED and admitted to medicine service with cardiology asked to consult in the setting of chest pain. He reports that his first episode started on Saturday around 2 AM he notes that he checked his BP and his SBP was around 200 SBP, he then took his BP meds at that time and his pain got better. He then had a repeated episode a night later. He denies any recent exertional symptoms, he reports being able to exercise on his stationary bike for around 30 minutes without any symptoms. He admits to continuing to drink around 3 beers/day.   Past Medical History:  Diagnosis Date   CAD in native artery 03/05/2022   Hyperlipidemia    Hypertension    Murmur 09/03/2022   Prediabetes    White coat syndrome with diagnosis of hypertension 01/26/2022   Past Surgical History:  Procedure Laterality Date   COLONOSCOPY  2016   NASAL SEPTOPLASTY W/ TURBINOPLASTY Bilateral 07/09/2020   Procedure: NASAL SEPTOPLASTY WITH BILATERAL  TURBINATE REDUCTION;  Surgeon: Karis Clunes, MD;  Location: [facility];  Service: ENT;  Laterality: Bilateral;    Home Medications:  Prior to Admission medications   Medication Sig Start Date End Date Taking? Authorizing Provider  albuterol (VENTOLIN HFA) 108 (90 Base) MCG/ACT inhaler Inhale 2 puffs into the lungs as needed for wheezing or shortness of breath. 07/27/22  Yes [provider]  budesonide-formoterol (SYMBICORT) 80-4.5 MCG/ACT inhaler Inhale 2 puffs into the lungs as needed. 07/17/22  Yes [provider]  diltiazem (DILACOR XR) 240 MG 24 hr capsule Take 480 mg by mouth daily.   Yes [provider]  fenofibrate 54 MG tablet Take 54 mg by mouth daily.   Yes [provider]  fluticasone (FLONASE) 50 MCG/ACT nasal spray Place into both nostrils daily.   Yes [provider]  hydrochlorothiazide  (HYDRODIURIL ) 25 MG tablet Take 1 tablet (25 mg total) by mouth daily. 05/13/23  Yes Raford Riggs, MD  loratadine  (CLARITIN) 10 MG tablet Take 10 mg by mouth daily.   Yes [provider]  omeprazole (PRILOSEC) 20 MG capsule Take 20 mg by mouth daily. 07/14/22  Yes [provider]  PARoxetine (PAXIL) 10 MG tablet Take 10 mg by mouth daily.   Yes [provider]  rosuvastatin  (CRESTOR ) 40 MG tablet TAKE 1 TABLET BY MOUTH DAILY 10/07/23  Yes Raford Riggs, MD  sildenafil  (VIAGRA ) 50 MG tablet Take 1 tablet (50 mg total) by mouth daily as needed for erectile dysfunction. 10/16/21  Yes Vannie Reche RAMAN, NP  spironolactone  (ALDACTONE ) 25 MG tablet TAKE 1 TABLET BY MOUTH DAILY 02/24/23  Yes Raford Riggs, MD  valsartan  (DIOVAN ) 320 MG tablet Take 1 tablet (320 mg total) by mouth daily. 06/24/23  Yes Raford Riggs, MD  tadalafil (CIALIS) 20 MG tablet Take 20 mg by mouth daily as needed for erectile dysfunction.    [provider]   Scheduled Meds:  aspirin  EC  81 mg Oral Daily   fluticasone furoate-vilanterol  1 puff Inhalation Daily   PARoxetine  10 mg Oral Daily   [START ON 02/09/2024] rosuvastatin   40 mg Oral Daily   Continuous Infusions:  nitroGLYCERIN      PRN Meds: acetaminophen , albuterol, hydrALAZINE, ondansetron  (ZOFRAN ) IV  Allergies:    Allergies  Allergen Reactions   Simvastatin Itching   Montelukast Rash   Social History:   Social History   Socioeconomic History   Marital status: Unknown    Spouse name: Not on file   Number of children: Not on file   Years of education: Not on file   Highest education level: Not on file  Occupational History   Not on file  Tobacco Use   Smoking status: Former   Smokeless tobacco: Never  Substance and Sexual Activity   Alcohol use: Yes    Comment: 3-4x per week   Drug use: Not Currently   Sexual activity: Not on file  Other Topics Concern   Not on file  Social History Narrative   Not on file   Social Drivers of Health   Financial Resource Strain: Low Risk  (09/11/2021)   Overall Financial Resource  Strain (CARDIA)    Difficulty of Paying Living Expenses: Not very hard  Food Insecurity: Food Insecurity Present (02/08/2024)   Hunger Vital Sign    Worried About Running Out of Food in the Last Year: Sometimes true    Ran Out of Food in the Last Year: Sometimes true  Transportation Needs: Unknown (02/08/2024)   PRAPARE - Administrator, Civil Service (Medical): No    Lack of Transportation (Non-Medical): Not on file  Physical Activity: Insufficiently Active (09/11/2021)   Exercise Vital Sign    Days of Exercise per Week: 1 day    Minutes of Exercise per Session: 10 min  Stress: No Stress Concern Present (09/11/2021)   [employer] of Occupational Health - Occupational Stress Questionnaire    Feeling of Stress : Only a little  Social Connections: Socially Integrated (02/08/2024)   Social Connection and Isolation Panel    Frequency of Communication with Friends and Family: Twice a week  Frequency of Social Gatherings with Friends and Family: Once a week    Attends Religious Services: More than 4 times per year    Active Member of [employer] or Organizations: Yes    Attends Engineer, Structural: More than 4 times per year    Marital Status: Married  Catering Manager Violence: Not At Risk (02/08/2024)   Humiliation, Afraid, Rape, and Kick questionnaire    Fear of Current or Ex-Partner: No    Emotionally Abused: No    Physically Abused: No    Sexually Abused: No    Family History:   Family History  Problem Relation Age of Onset   Heart attack Mother    Hypertension Mother    Heart attack Sister    Heart attack Brother     ROS:  Please see the history of present illness.  All other ROS reviewed and negative.     Physical Exam/Data: Vitals:   02/08/24 1005 02/08/24 1030 02/08/24 1159 02/08/24 1340  BP: 139/89 (!) 154/72 (!) 157/95 (!) 169/84  Pulse: 80 87 93 88  Resp: (!) 22 20 20 20   Temp:    98 F (36.7 C)  TempSrc:   Oral Oral  SpO2: 98% 98% 100% 100%   Weight:    83.8 kg  Height:    5' 1 (1.549 m)   No intake or output data in the 24 hours ending 02/08/24 1447    02/08/2024    1:40 PM 02/07/2024    9:27 AM 05/07/2023    9:37 AM  Last 3 Weights  Weight (lbs) 184 lb 11.9 oz 184 lb 192 lb 12.8 oz  Weight (kg) 83.8 kg 83.462 kg 87.454 kg     Body mass index is 34.91 kg/m.   General:  in no acute distress HEENT: normal Neck: no JVD Vascular: No carotid bruits; Distal pulses 2+ bilaterally Cardiac:  normal S1, S2; RRR; no murmur  Lungs:  clear to auscultation bilaterally, no wheezing, rhonchi or rales  Abd: soft, nontender, no hepatomegaly  Ext: no edema Musculoskeletal:  No deformities, BUE and BLE strength normal and equal Skin: warm and dry  Neuro:  no focal abnormalities noted Psych:  Normal affect   Telemetry:  Telemetry was personally reviewed and demonstrates:  sinus tachycardia, HR 110s  Relevant CV Studies:  Echocardiogram, 11/11/2022 Left ventricular ejection fraction, by estimation, is 50 to 55% . The left ventricle has low normal function. The left ventricle has no regional wall motion abnormalities. Left ventricular diastolic parameters are consistent with Grade I diastolic dysfunction ( impaired relaxation) .  Right ventricular systolic function is normal. The right ventricular size is normal.  The mitral valve is normal in structure. Mild mitral valve regurgitation. No evidence of mitral stenosis.  The aortic valve is normal in structure. Aortic valve regurgitation is not visualized. No aortic stenosis is present.  The inferior vena cava is normal in size with greater than 50% respiratory variability, suggesting right atrial pressure of 3 mmHg.  Coronary CTA, 10/20/2021 Coronary calcium  score of 1.0. This was 13th percentile for age-,sex, and race-matched controls. Normal coronary origin with right dominance Minimal mixed density plaque (<25%) in the LAD.  Minimal non-calcified plaque (<25%) in the ramus  Normal  LCX/RCA.  Laboratory Data: High Sensitivity Troponin:  No results for input(s): TROPONINIHS in the last 720 hours.   Chemistry Recent Labs  Lab 02/07/24 0952  NA 138  K 4.0  CL 103  CO2 24  GLUCOSE 132*  BUN  17  CREATININE 1.29*  CALCIUM  10.5*  GFRNONAA 58*  ANIONGAP 12    No results for input(s): PROT, ALBUMIN, AST, ALT, ALKPHOS, BILITOT in the last 168 hours. Lipids No results for input(s): CHOL, TRIG, HDL, LABVLDL, LDLCALC, CHOLHDL in the last 168 hours.  Hematology Recent Labs  Lab 02/07/24 0952  WBC 16.1*  RBC 5.30  HGB 14.3  HCT 43.6  MCV 82.3  MCH 27.0  MCHC 32.8  RDW 14.9  PLT 169   Thyroid  No results for input(s): TSH, FREET4 in the last 168 hours.  BNPNo results for input(s): BNP, PROBNP in the last 168 hours.  DDimer  Recent Labs  Lab 02/08/24 1348  DDIMER <0.27    Radiology/Studies:  DG Chest 2 View Result Date: 02/07/2024 EXAM: 2 VIEW(S) XRAY OF THE CHEST 02/07/2024 09:38:00 AM COMPARISON: PA and lateral radiographs of the chest dated 01/12/2017. CLINICAL HISTORY: cp FINDINGS: LUNGS AND PLEURA: No focal pulmonary opacity. No pulmonary edema. No pleural effusion. No pneumothorax. HEART AND MEDIASTINUM: No acute abnormality of the cardiac and mediastinal silhouettes. BONES AND SOFT TISSUES: No acute osseous abnormality. IMPRESSION: 1. No acute process. Electronically signed by: Evalene Coho MD 02/07/2024 10:09 AM EST RP Workstation: [id number]   Assessment and Plan:  NSTEMI Presented to ED with chest pain x 2 days  Troponin 379 ? 335 Reports that he had chest pain that occurred around 2 AM  Noted high BP with chest pain, SBP > 200 Took his BP meds and this improved his symptoms  Loaded with ASA in ED  Plan for [facility] tomorrow  NPO at midnight Start IV heparin   Informed Consent   Shared Decision Making/Informed Consent The risks [stroke (1 in 1000), death (1 in 1000), kidney failure [usually temporary] (1 in  500), bleeding (1 in 200), allergic reaction [possibly serious] (1 in 200)], benefits (diagnostic support and management of coronary artery disease) and alternatives of a cardiac catheterization were discussed in detail with Mr. Basnett and he is willing to proceed.    Hypertension Home meds: diltiazem 480 mg daily, hydrochlorothiazide  25 mg daily, spironolactone  25 mg daily, valsartan  320 mg daily Reports BP numbers at home are typically 130-150s Reports compliance with medications  Most home medications on hold due to AKI  Will start IV NTG for BP control Consider adjusting home medications/adding amlodipine or BB if needed due to renal function   Nonobstructive CAD Hyperlipidemia Home meds: rosuvastatin  40 mg daily, fenofibrate 54 mg daily CCTA 10/2021: calcium  score 1 (13th percentile), mild nonobstructive CAD Continue home medications Start ASA 81 mg Plan for LHC as above  Check lipid panel in AM  Risk Assessment/Risk Scores:    TIMI Risk Score for Unstable Angina or Non-ST Elevation MI:   The patient's TIMI risk score is 4, which indicates a 20% risk of all cause mortality, new or recurrent myocardial infarction or need for urgent revascularization in the next 14 days.   For questions or updates, please contact [facility] Please consult [email or web address] for contact info under   Signed, Waddell DELENA Donath, PA-C  02/08/2024 2:47 PM  Patient seen and examined, note reviewed with the signed Advanced Practice Provider. I personally reviewed laboratory data, imaging studies and relevant notes. I independently examined the patient and formulated the important aspects of the plan. I have personally discussed the plan with the patient and/or family. Comments or changes to the note/plan are indicated below.  Patient seen and examined at his bedside. No recurrent chest pain.  But with risk factors he will benefit from an ischemic evaluation. In this case a heart cath will be the  most beneficial. He is agreeable for this.   In the meantime, for his NSTEMI will benefit from heparin gtt, add nitroglycerin  gtt in to help with his elevated blood pressure. Continue aspirin  received a dose in the ED.   Plan to restart his home antihypertensive once we transition of nitroglycerin  gtt.   Will repeat an echo - echo from 2024 reviewed.    Continue home crestor .   Informed Consent   Shared Decision Making/Informed Consent The risks [stroke (1 in 1000), death (1 in 1000), kidney failure [usually temporary] (1 in 500), bleeding (1 in 200), allergic reaction [possibly serious] (1 in 200)], benefits (diagnostic support and management of coronary artery disease) and alternatives of a cardiac catheterization were discussed in detail with Mr. Homann and he is willing to proceed.      Sharnita Bogucki DO, MS [facility] Attending Cardiologist [facility]  [address] [place], [place] [address] [phone] Website: [email or web address]

## 2024-02-09 ENCOUNTER — Encounter (HOSPITAL_COMMUNITY)
Admission: EM | Disposition: A | Discharge status: Home / Self Care | Payer: Self-pay | Source: Home / Self Care | Attending: Internal Medicine

## 2024-02-09 ENCOUNTER — Inpatient Hospital Stay (HOSPITAL_COMMUNITY)

## 2024-02-09 DIAGNOSIS — I214 Non-ST elevation (NSTEMI) myocardial infarction: Secondary | ICD-10-CM | POA: Diagnosis not present

## 2024-02-09 DIAGNOSIS — R7989 Other specified abnormal findings of blood chemistry: Secondary | ICD-10-CM | POA: Diagnosis not present

## 2024-02-09 HISTORY — PX: LEFT HEART CATH AND CORONARY ANGIOGRAPHY: CATH118249

## 2024-02-09 LAB — CBC
HCT: 42.2 % (ref 39.0–52.0)
Hemoglobin: 13.5 g/dL (ref 13.0–17.0)
MCH: 26.7 pg (ref 26.0–34.0)
MCHC: 32 g/dL (ref 30.0–36.0)
MCV: 83.6 fL (ref 80.0–100.0)
Platelets: 166 K/uL (ref 150–400)
RBC: 5.05 MIL/uL (ref 4.22–5.81)
RDW: 15 % (ref 11.5–15.5)
WBC: 11.1 K/uL — ABNORMAL HIGH (ref 4.0–10.5)
nRBC: 0 % (ref 0.0–0.2)

## 2024-02-09 LAB — ECHOCARDIOGRAM COMPLETE
Area-P 1/2: 3.77 cm2
Height: 61 in
S' Lateral: 2.6 cm
Weight: 2955.93 [oz_av]

## 2024-02-09 LAB — BASIC METABOLIC PANEL WITH GFR
Anion gap: 12 (ref 5–15)
BUN: 15 mg/dL (ref 8–23)
CO2: 22 mmol/L (ref 22–32)
Calcium: 9.5 mg/dL (ref 8.9–10.3)
Chloride: 101 mmol/L (ref 98–111)
Creatinine, Ser: 1.19 mg/dL (ref 0.61–1.24)
GFR, Estimated: >60 mL/min (ref >60)
Glucose, Bld: 139 mg/dL — ABNORMAL HIGH (ref 70–99)
Potassium: 3.9 mmol/L (ref 3.5–5.1)
Sodium: 135 mmol/L (ref 135–145)

## 2024-02-09 LAB — LIPID PANEL
Cholesterol: 141 mg/dL (ref 0–200)
HDL: 53 mg/dL (ref >40)
LDL Cholesterol: 58 mg/dL (ref 0–99)
Total CHOL/HDL Ratio: 2.7 ratio
Triglycerides: 148 mg/dL (ref <150)
VLDL: 30 mg/dL (ref 0–40)

## 2024-02-09 LAB — HEPARIN LEVEL (UNFRACTIONATED): Heparin Unfractionated: 0.3 [IU]/mL (ref 0.30–0.70)

## 2024-02-09 SURGERY — LEFT HEART CATH AND CORONARY ANGIOGRAPHY
Anesthesia: LOCAL

## 2024-02-09 MED ORDER — MIDAZOLAM HCL 2 MG/2ML IJ SOLN
INTRAMUSCULAR | Status: AC
  Filled 2024-02-09: qty 2

## 2024-02-09 MED ORDER — SODIUM CHLORIDE 0.9% FLUSH
3.0000 mL | Freq: Two times a day (BID) | INTRAVENOUS | Status: DC
  Administered 2024-02-10: 3 mL via INTRAVENOUS

## 2024-02-09 MED ORDER — SODIUM CHLORIDE 0.9% FLUSH: 3.0000 mL | INTRAVENOUS | Status: DC | PRN

## 2024-02-09 MED ORDER — LABETALOL HCL 5 MG/ML IV SOLN
INTRAVENOUS | Status: DC | PRN
  Administered 2024-02-09: 10 mg via INTRAVENOUS

## 2024-02-09 MED ORDER — LIDOCAINE HCL (PF) 1 % IJ SOLN
INTRAMUSCULAR | Status: DC | PRN
Start: 2024-02-09 — End: 2024-02-09
  Administered 2024-02-09: 5 mL via INTRADERMAL

## 2024-02-09 MED ORDER — MIDAZOLAM HCL (PF) 2 MG/2ML IJ SOLN
INTRAMUSCULAR | Status: DC | PRN
  Administered 2024-02-09 (×2): 1 mg via INTRAVENOUS

## 2024-02-09 MED ORDER — FENTANYL CITRATE (PF) 100 MCG/2ML IJ SOLN
INTRAMUSCULAR | Status: AC
  Filled 2024-02-09: qty 2

## 2024-02-09 MED ORDER — SODIUM CHLORIDE 0.9 % IV SOLN: 250.0000 mL | INTRAVENOUS | Status: DC | PRN

## 2024-02-09 MED ORDER — LIDOCAINE HCL (PF) 1 % IJ SOLN
INTRAMUSCULAR | Status: AC
  Filled 2024-02-09: qty 30

## 2024-02-09 MED ORDER — FREE WATER
500.0000 mL | Freq: Once | Status: AC
  Administered 2024-02-09: 500 mL via ORAL

## 2024-02-09 MED ORDER — DILTIAZEM HCL ER COATED BEADS 240 MG PO CP24
480.0000 mg | ORAL_CAPSULE | Freq: Every day | ORAL | Status: DC
  Administered 2024-02-09 – 2024-02-10 (×2): 480 mg via ORAL
  Filled 2024-02-09: qty 2
  Filled 2024-02-09 (×2): qty 4
  Filled 2024-02-09 (×3): qty 2

## 2024-02-09 MED ORDER — FENTANYL CITRATE (PF) 100 MCG/2ML IJ SOLN
INTRAMUSCULAR | Status: DC | PRN
  Administered 2024-02-09 (×2): 25 ug via INTRAVENOUS

## 2024-02-09 MED ORDER — LABETALOL HCL 5 MG/ML IV SOLN: 10.0000 mg | INTRAVENOUS | Status: AC | PRN

## 2024-02-09 MED ORDER — PERFLUTREN LIPID MICROSPHERE
1.0000 mL | INTRAVENOUS | Status: DC | PRN
  Administered 2024-02-09: 3 mL via INTRAVENOUS

## 2024-02-09 MED ORDER — HEPARIN SODIUM (PORCINE) 1000 UNIT/ML IJ SOLN
INTRAMUSCULAR | Status: DC | PRN
Start: 2024-02-09 — End: 2024-02-09
  Administered 2024-02-09: 4000 [IU] via INTRAVENOUS

## 2024-02-09 MED ORDER — IRBESARTAN 150 MG PO TABS
300.0000 mg | ORAL_TABLET | Freq: Every day | ORAL | Status: DC
  Administered 2024-02-09 – 2024-02-10 (×2): 300 mg via ORAL
  Filled 2024-02-09 (×2): qty 2

## 2024-02-09 SURGICAL SUPPLY — 7 items
CATH INFINITI AMBI 5FR JK (CATHETERS) IMPLANT
COVER DOME SNAP 22 D (MISCELLANEOUS) IMPLANT
DEVICE RAD COMP TR BAND LRG (VASCULAR PRODUCTS) IMPLANT
GLIDESHEATH SLEND SS 6F .021 (SHEATH) IMPLANT
GUIDEWIRE INQWIRE 1.5J.035X260 (WIRE) IMPLANT
PACK CARDIAC CATHETERIZATION (CUSTOM PROCEDURE TRAY) ×1 IMPLANT
SET ATX-X65L (MISCELLANEOUS) IMPLANT

## 2024-02-09 NOTE — Progress Notes (Signed)
 PHARMACY - ANTICOAGULATION CONSULT NOTE  Pharmacy Consult:  Heparin Indication: chest pain/ACS  Allergies  Allergen Reactions   Simvastatin Itching   Montelukast Rash    Patient Measurements: Height: 5' 1 (154.9 cm) Weight: 83.8 kg (184 lb 11.9 oz) IBW/kg (Calculated) : 52.3 HEPARIN DW (KG): 70.9  Vital Signs: Temp: 97.8 F (36.6 C) (11/04 2252) Temp Source: Oral (11/04 2252) BP: 126/85 (11/04 2252) Pulse Rate: 87 (11/04 2252)  Labs: Recent Labs    02/07/24 0952 02/08/24 1348 02/09/24 0226  HGB 14.3  --  13.5  HCT 43.6  --  42.2  PLT 169  --  166  HEPARINUNFRC  --   --  0.30  CREATININE 1.29*  --   --   TROPONINIHS  --  689*  --     Estimated Creatinine Clearance: 45.4 mL/min (A) (by C-G formula based on SCr of 1.29 mg/dL (H)).   Medical History: Past Medical History:  Diagnosis Date   CAD in native artery 03/05/2022   Hyperlipidemia    Hypertension    Murmur 09/03/2022   Prediabetes    White coat syndrome with diagnosis of hypertension 01/26/2022     Assessment: 73 YOM presented to [facility] with chest pain and then transferred to [facility] for cath.  Pharmacy consulted to dose IV heparin.  Patient is not on a blood thinner PTA per med history.  Baseline labs reviewed.  11/5 AM update:  Heparin level therapeutic  Goal of Therapy:  Heparin level 0.3-0.7 units/ml Monitor platelets by anticoagulation protocol: Yes   Plan:  Cont heparin 900 units/hr Check 8 hr heparin level Daily heparin level and CBC  Lynwood Mckusick, PharmD, BCPS Clinical Pharmacist Phone: [phone]

## 2024-02-09 NOTE — Interval H&P Note (Signed)
 History and Physical Interval Note:  02/09/2024 12:23 PM  Shane Cabrera  has presented today for surgery, with the diagnosis of nstemi.  The various methods of treatment have been discussed with the patient and family. After consideration of risks, benefits and other options for treatment, the patient has consented to  Procedure(s): LEFT HEART CATH AND CORONARY ANGIOGRAPHY (N/A) as a surgical intervention.  The patient's history has been reviewed, patient examined, no change in status, stable for surgery.  I have reviewed the patient's chart and labs.  Questions were answered to the patient's satisfaction.     Anslee Micheletti

## 2024-02-09 NOTE — Plan of Care (Signed)

## 2024-02-09 NOTE — Progress Notes (Signed)
 Progress Note  Patient Name: Shane Cabrera Date of Encounter: 02/09/2024  Primary Cardiologist: Annabella Scarce, MD   Subjective   Patient seen and examined at his bedside. Aware of heart cath today.   Inpatient Medications    Scheduled Meds:  aspirin  EC  81 mg Oral Daily   fluticasone furoate-vilanterol  1 puff Inhalation Daily   PARoxetine  10 mg Oral Daily   rosuvastatin   40 mg Oral Daily   Continuous Infusions:  sodium chloride 1 mL/kg/hr (02/09/24 0614)   heparin 900 Units/hr (02/08/24 1628)   nitroGLYCERIN  15 mcg/min (02/08/24 1740)   PRN Meds: acetaminophen , albuterol, hydrALAZINE, LORazepam, melatonin, ondansetron  (ZOFRAN ) IV, perflutren lipid microspheres (DEFINITY) IV suspension   Vital Signs    Vitals:   02/08/24 2000 02/08/24 2252 02/09/24 0334 02/09/24 0748  BP:  126/85 (!) 153/87 139/78  Pulse: 88 87 98 84  Resp:  18 20 15   Temp:  97.8 F (36.6 C) 97.6 F (36.4 C) 98.3 F (36.8 C)  TempSrc:  Oral Oral Oral  SpO2: 97% 100% 98% 98%  Weight:      Height:        Intake/Output Summary (Last 24 hours) at 02/09/2024 1044 Last data filed at 02/09/2024 9662 Gross per 24 hour  Intake 403.09 ml  Output 400 ml  Net 3.09 ml   Filed Weights   02/07/24 0927 02/08/24 1340  Weight: 83.5 kg 83.8 kg    Telemetry     Sinus rhythm - Personally Reviewed  ECG     - Personally Reviewed  Physical Exam    General: Comfortable, sitting up in bed Head: Atraumatic, normal size  Eyes: PEERLA, EOMI  Neck: Supple, normal JVD Cardiac: Normal S1, S2; RRR; no murmurs, rubs, or gallops Lungs: Clear to auscultation bilaterally Abd: Soft, nontender, no hepatomegaly  Ext: warm, no edema Musculoskeletal: No deformities, BUE and BLE strength normal and equal Skin: Warm and dry, no rashes   Neuro: Alert and oriented to person, place, time, and situation, CNII-XII grossly intact, no focal deficits  Psych: Normal mood and affect   Labs    Chemistry Recent Labs   Lab 02/07/24 0952 02/08/24 1348 02/09/24 0823  NA 138  --  135  K 4.0  --  3.9  CL 103  --  101  CO2 24  --  22  GLUCOSE 132*  --  139*  BUN 17  --  15  CREATININE 1.29*  --  1.19  CALCIUM  10.5*  --  9.5  PROT  --  7.1  --   ALBUMIN  --  3.7  --   AST  --  35  --   ALT  --  29  --   ALKPHOS  --  50  --   BILITOT  --  1.0  --   GFRNONAA 58*  --  >60  ANIONGAP 12  --  12     Hematology Recent Labs  Lab 02/07/24 0952 02/09/24 0226  WBC 16.1* 11.1*  RBC 5.30 5.05  HGB 14.3 13.5  HCT 43.6 42.2  MCV 82.3 83.6  MCH 27.0 26.7  MCHC 32.8 32.0  RDW 14.9 15.0  PLT 169 166    Cardiac EnzymesNo results for input(s): TROPONINI in the last 168 hours. No results for input(s): TROPIPOC in the last 168 hours.   BNPNo results for input(s): BNP, PROBNP in the last 168 hours.   DDimer  Recent Labs  Lab 02/08/24 1348  DDIMER <0.27  Radiology    No results found.  Cardiac Studies     Patient Profile     73 y.o. male past medical hx of Hypertension, HLN presents with chest pain and elevated trop  Assessment & Plan     NSTEMI Hypertensive urgency - improved.   Plans for heart cath today. I was able to answer all his questions.  For now continue heparin gt, nitroglycerin  gtt. Plans for restart home antihypertensive post cath.   Continue Aspirin  and crestor .   Echo pending today   Informed Consent   Shared Decision Making/Informed Consent The risks [stroke (1 in 1000), death (1 in 1000), kidney failure [usually temporary] (1 in 500), bleeding (1 in 200), allergic reaction [possibly serious] (1 in 200)], benefits (diagnostic support and management of coronary artery disease) and alternatives of a cardiac catheterization were discussed in detail with Shane Cabrera and he is willing to proceed.       For questions or updates, please contact [facility] Please consult [email or web address] for contact info under Cardiology/STEMI.      Signed, Hortense Cantrall, DO   02/09/2024, 10:44 AM

## 2024-02-09 NOTE — Plan of Care (Signed)

## 2024-02-09 NOTE — Progress Notes (Addendum)
 Restarting home diltiazem 480mg  daily today per Dr. Sheena. She anticipates patient would remain inpatient through tomorrow to trend renal function post cath since admitting with AKI before restarting other home ARB/hydrochlorothiazide .

## 2024-02-09 NOTE — Progress Notes (Signed)
 [facility] PROGRESS NOTE   Shane Cabrera FMW:[id number] DOB: 01/12/1951 DOA: 02/07/2024  PCP: Dayna Motto, DO  Brief History: 73 y.o. male with past medical history  HTN, HLD, GERD, ED, BPH, hepatic fibrosis, depression/anxiety, p/w from [facility] w/ 2 days of intermittent chest pain.  Patient was found to have elevated troponin levels.  He was hospitalized for further management.   Consultants: Cardiology  Procedures: Plan is for cardiac catheterization later today    Subjective/Interval History: Patient denies any chest pain this morning.  No shortness of breath.    Assessment/Plan:  Chest pain/NSTEMI Patient presented with chest discomfort.  Found to have elevated troponin level. Patient has a history of CAD.  Had a coronary calcium  score of 1 in 2023. Plan is for cardiac catheterization. Cardiology is following. Patient is on aspirin , heparin, nitroglycerin .  Patient also on statin.  LDL noted to be 58. Echocardiogram is pending  Essential hypertension Noted to have high blood pressures initially.  Was started on nitroglycerin  infusion with improvement. HCTZ and valsartan  currently on hold.  Mild AKI Resolved.  Leukocytosis Likely reactive.  Noted to be better this morning.  Class I obesity Estimated body mass index is 34.91 kg/m as calculated from the following:   Height as of this encounter: 5' 1 (1.549 m).   Weight as of this encounter: 83.8 kg.  DVT Prophylaxis: On IV heparin Code Status: Full code Family Communication: Discussed with patient Disposition Plan: Hopefully return home when improved  Status is: Inpatient Remains inpatient appropriate because: NSTEMI      Medications: Scheduled:  aspirin  EC  81 mg Oral Daily   fluticasone furoate-vilanterol  1 puff Inhalation Daily   PARoxetine  10 mg Oral Daily   rosuvastatin   40 mg Oral Daily   Continuous:  sodium chloride 1 mL/kg/hr (02/09/24 0614)   heparin 900 Units/hr  (02/08/24 1628)   nitroGLYCERIN  15 mcg/min (02/08/24 1740)   PRN:acetaminophen , albuterol, hydrALAZINE, LORazepam, melatonin, ondansetron  (ZOFRAN ) IV, perflutren lipid microspheres (DEFINITY) IV suspension  Antibiotics: Anti-infectives (From admission, onward)    None       Objective:  Vital Signs  Vitals:   02/08/24 2000 02/08/24 2252 02/09/24 0334 02/09/24 0748  BP:  126/85 (!) 153/87 139/78  Pulse: 88 87 98 84  Resp:  18 20 15   Temp:  97.8 F (36.6 C) 97.6 F (36.4 C) 98.3 F (36.8 C)  TempSrc:  Oral Oral Oral  SpO2: 97% 100% 98% 98%  Weight:      Height:        Intake/Output Summary (Last 24 hours) at 02/09/2024 0949 Last data filed at 02/09/2024 0337 Gross per 24 hour  Intake 403.09 ml  Output 400 ml  Net 3.09 ml   Filed Weights   02/07/24 0927 02/08/24 1340  Weight: 83.5 kg 83.8 kg    General appearance: Awake alert.  In no distress Resp: Clear to auscultation bilaterally.  Normal effort Cardio: S1-S2 is normal regular.  No S3-S4.  No rubs murmurs or bruit GI: Abdomen is soft.  Nontender nondistended.  Bowel sounds are present normal.  No masses organomegaly Extremities: No edema.  Full range of motion of lower extremities. Neurologic: Alert and oriented x3.  No focal neurological deficits.    Lab Results:  Data Reviewed: I have personally reviewed following labs and reports of the imaging studies  CBC: Recent Labs  Lab 02/07/24 0952 02/09/24 0226  WBC 16.1* 11.1*  HGB 14.3 13.5  HCT 43.6 42.2  MCV 82.3 83.6  PLT 169 166    Basic Metabolic Panel: Recent Labs  Lab 02/07/24 0952 02/09/24 0823  NA 138 135  K 4.0 3.9  CL 103 101  CO2 24 22  GLUCOSE 132* 139*  BUN 17 15  CREATININE 1.29* 1.19  CALCIUM  10.5* 9.5    GFR: Estimated Creatinine Clearance: 49.2 mL/min (by C-G formula based on SCr of 1.19 mg/dL).  Liver Function Tests: Recent Labs  Lab 02/08/24 1348  AST 35  ALT 29  ALKPHOS 50  BILITOT 1.0  PROT 7.1  ALBUMIN 3.7     HbA1C: Recent Labs    02/08/24 1348  HGBA1C 5.7*   Lipid Profile: Recent Labs    02/09/24 0226  CHOL 141  HDL 53  LDLCALC 58  TRIG 148  CHOLHDL 2.7    Thyroid  Function Tests: Recent Labs    02/08/24 1348  TSH 1.377  FREET4 0.77    Recent Results (from the past 240 hours)  MRSA Next Gen by PCR, Nasal     Status: None   Collection Time: 02/08/24  1:50 PM   Specimen: Nasal Mucosa; Nasal Swab  Result Value Ref Range Status   MRSA by PCR Next Gen NOT DETECTED NOT DETECTED Final    Comment: (NOTE) The GeneXpert MRSA Assay (FDA approved for NASAL specimens only), is one component of a comprehensive MRSA colonization surveillance program. It is not intended to diagnose MRSA infection nor to guide or monitor treatment for MRSA infections. Test performance is not FDA approved in patients less than 68 years old. Performed at [facility], [address] [place], [place] [address]       Radiology Studies: No results found.     LOS: 1 day   Shane Cabrera  [facility] Pager on [email or web address]  02/09/2024, 9:49 AM

## 2024-02-09 NOTE — ED Notes (Signed)
 Security needed me to access chart to get the spouse phone number.  Patient was admitted at [facility] but left car here at [facility]--Info given to security

## 2024-02-09 NOTE — H&P (View-Only) (Signed)
 Progress Note  Patient Name: Shane Cabrera Date of Encounter: 02/09/2024  Primary Cardiologist: Annabella Scarce, MD   Subjective   Patient seen and examined at his bedside. Aware of heart cath today.   Inpatient Medications    Scheduled Meds:  aspirin  EC  81 mg Oral Daily   fluticasone furoate-vilanterol  1 puff Inhalation Daily   PARoxetine  10 mg Oral Daily   rosuvastatin   40 mg Oral Daily   Continuous Infusions:  sodium chloride 1 mL/kg/hr (02/09/24 0614)   heparin 900 Units/hr (02/08/24 1628)   nitroGLYCERIN  15 mcg/min (02/08/24 1740)   PRN Meds: acetaminophen , albuterol, hydrALAZINE, LORazepam, melatonin, ondansetron  (ZOFRAN ) IV, perflutren lipid microspheres (DEFINITY) IV suspension   Vital Signs    Vitals:   02/08/24 2000 02/08/24 2252 02/09/24 0334 02/09/24 0748  BP:  126/85 (!) 153/87 139/78  Pulse: 88 87 98 84  Resp:  18 20 15   Temp:  97.8 F (36.6 C) 97.6 F (36.4 C) 98.3 F (36.8 C)  TempSrc:  Oral Oral Oral  SpO2: 97% 100% 98% 98%  Weight:      Height:        Intake/Output Summary (Last 24 hours) at 02/09/2024 1044 Last data filed at 02/09/2024 9662 Gross per 24 hour  Intake 403.09 ml  Output 400 ml  Net 3.09 ml   Filed Weights   02/07/24 0927 02/08/24 1340  Weight: 83.5 kg 83.8 kg    Telemetry     Sinus rhythm - Personally Reviewed  ECG     - Personally Reviewed  Physical Exam    General: Comfortable, sitting up in bed Head: Atraumatic, normal size  Eyes: PEERLA, EOMI  Neck: Supple, normal JVD Cardiac: Normal S1, S2; RRR; no murmurs, rubs, or gallops Lungs: Clear to auscultation bilaterally Abd: Soft, nontender, no hepatomegaly  Ext: warm, no edema Musculoskeletal: No deformities, BUE and BLE strength normal and equal Skin: Warm and dry, no rashes   Neuro: Alert and oriented to person, place, time, and situation, CNII-XII grossly intact, no focal deficits  Psych: Normal mood and affect   Labs    Chemistry Recent Labs   Lab 02/07/24 0952 02/08/24 1348 02/09/24 0823  NA 138  --  135  K 4.0  --  3.9  CL 103  --  101  CO2 24  --  22  GLUCOSE 132*  --  139*  BUN 17  --  15  CREATININE 1.29*  --  1.19  CALCIUM  10.5*  --  9.5  PROT  --  7.1  --   ALBUMIN  --  3.7  --   AST  --  35  --   ALT  --  29  --   ALKPHOS  --  50  --   BILITOT  --  1.0  --   GFRNONAA 58*  --  >60  ANIONGAP 12  --  12     Hematology Recent Labs  Lab 02/07/24 0952 02/09/24 0226  WBC 16.1* 11.1*  RBC 5.30 5.05  HGB 14.3 13.5  HCT 43.6 42.2  MCV 82.3 83.6  MCH 27.0 26.7  MCHC 32.8 32.0  RDW 14.9 15.0  PLT 169 166    Cardiac EnzymesNo results for input(s): TROPONINI in the last 168 hours. No results for input(s): TROPIPOC in the last 168 hours.   BNPNo results for input(s): BNP, PROBNP in the last 168 hours.   DDimer  Recent Labs  Lab 02/08/24 1348  DDIMER <0.27  Radiology    No results found.  Cardiac Studies     Patient Profile     73 y.o. male past medical hx of Hypertension, HLN presents with chest pain and elevated trop  Assessment & Plan     NSTEMI Hypertensive urgency - improved.   Plans for heart cath today. I was able to answer all his questions.  For now continue heparin gt, nitroglycerin  gtt. Plans for restart home antihypertensive post cath.   Continue Aspirin  and crestor .   Echo pending today   Informed Consent   Shared Decision Making/Informed Consent The risks [stroke (1 in 1000), death (1 in 1000), kidney failure [usually temporary] (1 in 500), bleeding (1 in 200), allergic reaction [possibly serious] (1 in 200)], benefits (diagnostic support and management of coronary artery disease) and alternatives of a cardiac catheterization were discussed in detail with Shane Cabrera and he is willing to proceed.       For questions or updates, please contact [facility] Please consult [email or web address] for contact info under Cardiology/STEMI.      Signed, Hortense Cantrall, DO   02/09/2024, 10:44 AM

## 2024-02-10 ENCOUNTER — Encounter (HOSPITAL_COMMUNITY): Payer: Self-pay | Admitting: Cardiovascular Disease

## 2024-02-10 ENCOUNTER — Other Ambulatory Visit (HOSPITAL_COMMUNITY): Payer: Self-pay

## 2024-02-10 ENCOUNTER — Other Ambulatory Visit (HOSPITAL_BASED_OUTPATIENT_CLINIC_OR_DEPARTMENT_OTHER): Payer: Self-pay

## 2024-02-10 DIAGNOSIS — I214 Non-ST elevation (NSTEMI) myocardial infarction: Secondary | ICD-10-CM | POA: Diagnosis not present

## 2024-02-10 DIAGNOSIS — R079 Chest pain, unspecified: Secondary | ICD-10-CM | POA: Diagnosis not present

## 2024-02-10 DIAGNOSIS — R072 Precordial pain: Secondary | ICD-10-CM | POA: Diagnosis not present

## 2024-02-10 LAB — BASIC METABOLIC PANEL WITH GFR
Anion gap: 12 (ref 5–15)
BUN: 19 mg/dL (ref 8–23)
CO2: 22 mmol/L (ref 22–32)
Calcium: 9.1 mg/dL (ref 8.9–10.3)
Chloride: 102 mmol/L (ref 98–111)
Creatinine, Ser: 1.24 mg/dL (ref 0.61–1.24)
GFR, Estimated: >60 mL/min (ref >60)
Glucose, Bld: 127 mg/dL — ABNORMAL HIGH (ref 70–99)
Potassium: 3.9 mmol/L (ref 3.5–5.1)
Sodium: 136 mmol/L (ref 135–145)

## 2024-02-10 LAB — CBC
HCT: 39.9 % (ref 39.0–52.0)
Hemoglobin: 12.7 g/dL — ABNORMAL LOW (ref 13.0–17.0)
MCH: 26.8 pg (ref 26.0–34.0)
MCHC: 31.8 g/dL (ref 30.0–36.0)
MCV: 84.4 fL (ref 80.0–100.0)
Platelets: 150 K/uL (ref 150–400)
RBC: 4.73 MIL/uL (ref 4.22–5.81)
RDW: 15 % (ref 11.5–15.5)
WBC: 10 K/uL (ref 4.0–10.5)
nRBC: 0 % (ref 0.0–0.2)

## 2024-02-10 MED ORDER — SPIRONOLACTONE 25 MG PO TABS
25.0000 mg | ORAL_TABLET | Freq: Every day | ORAL | Status: DC
  Administered 2024-02-10: 25 mg via ORAL
  Filled 2024-02-10: qty 1

## 2024-02-10 MED ORDER — ASPIRIN 81 MG PO TBEC
81.0000 mg | DELAYED_RELEASE_TABLET | Freq: Every day | ORAL | 0 refills | Status: AC
  Filled 2024-02-10: qty 90, 90d supply, fill #0

## 2024-02-10 NOTE — Progress Notes (Signed)
 AVS gone over with patient, answered any/all questions. Ivs removed, patient belongings gathered and patient was taken out to family vehicle.

## 2024-02-10 NOTE — Discharge Summary (Signed)
 [facility]  Physician Discharge Summary   Patient ID: Shane Cabrera MRN: [id number] DOB/AGE: 11-06-1950 73 y.o.  Admit date: 02/07/2024 Discharge date: 02/10/2024    PCP: Dayna Motto, DO  DISCHARGE DIAGNOSES:  NSTEMI Hypertensive urgency   RECOMMENDATIONS FOR OUTPATIENT FOLLOW UP: Cardiology to schedule outpatient follow-up   Home Health: None Equipment/Devices: None  CODE STATUS: Full code  DISCHARGE CONDITION: fair  Diet recommendation: Heart healthy  INITIAL HISTORY: 73 y.o. male with past medical history  HTN, HLD, GERD, ED, BPH, hepatic fibrosis, depression/anxiety, p/w from [facility] w/ 2 days of intermittent chest pain.  Patient was found to have elevated troponin levels.  He was hospitalized for further management.    Consultants: Cardiology   Procedures:  Cardiac catheterization: No significant CAD noted  HOSPITAL COURSE:   Chest pain/NSTEMI Patient presented with chest discomfort.  Found to have elevated troponin level. Patient has a history of CAD.  Had a coronary calcium  score of 1 in 2023. Patient was seen by cardiology.  Underwent cardiac catheterization which did not show any significant CAD.  Echocardiogram shows normal LVEF.  LDL noted to be 58.  Patient will be discharged on aspirin  and statin.  Cardiology to arrange outpatient follow-up.   Essential hypertension Noted to have high blood pressures initially.  Was started on nitroglycerin  infusion with improvement. Patient will continue with valsartan  diltiazem and spironolactone .   Mild AKI Resolved.   Leukocytosis Likely reactive.    Class I obesity Estimated body mass index is 34.91 kg/m as calculated from the following:   Height as of this encounter: 5' 1 (1.549 m).   Weight as of this encounter: 83.8 kg.  Patient is stable.  Okay for discharge home today.   PERTINENT LABS:  The results of significant diagnostics from this hospitalization (including imaging,  microbiology, ancillary and laboratory) are listed below for reference.    Microbiology: Recent Results (from the past 240 hours)  MRSA Next Gen by PCR, Nasal     Status: None   Collection Time: 02/08/24  1:50 PM   Specimen: Nasal Mucosa; Nasal Swab  Result Value Ref Range Status   MRSA by PCR Next Gen NOT DETECTED NOT DETECTED Final    Comment: (NOTE) The GeneXpert MRSA Assay (FDA approved for NASAL specimens only), is one component of a comprehensive MRSA colonization surveillance program. It is not intended to diagnose MRSA infection nor to guide or monitor treatment for MRSA infections. Test performance is not FDA approved in patients less than 40 years old. Performed at [facility], [address] [place], [place] [address]      Labs:   Basic Metabolic Panel: Recent Labs  Lab 02/07/24 9047 02/09/24 0823 02/10/24 0225  NA 138 135 136  K 4.0 3.9 3.9  CL 103 101 102  CO2 24 22 22   GLUCOSE 132* 139* 127*  BUN 17 15 19   CREATININE 1.29* 1.19 1.24  CALCIUM  10.5* 9.5 9.1   Liver Function Tests: Recent Labs  Lab 02/08/24 1348  AST 35  ALT 29  ALKPHOS 50  BILITOT 1.0  PROT 7.1  ALBUMIN 3.7   CBC: Recent Labs  Lab 02/07/24 0952 02/09/24 0226 02/10/24 0225  WBC 16.1* 11.1* 10.0  HGB 14.3 13.5 12.7*  HCT 43.6 42.2 39.9  MCV 82.3 83.6 84.4  PLT 169 166 150     IMAGING STUDIES CARDIAC CATHETERIZATION Result Date: 02/09/2024 1.  Mild irregularities with no evidence of obstructive coronary artery disease. 2.  Left ventricular angiography  was not performed.  EF was normal by echo.  Mildly elevated left ventricular end-diastolic pressure. Recommendations: Elevated troponin is likely due to supply/demand ischemia in the setting of elevated blood pressure.  Recommend checking urine drug screen. Recommend blood pressure control and weaning off nitroglycerin  drip.   ECHOCARDIOGRAM COMPLETE Result Date: 02/09/2024    ECHOCARDIOGRAM REPORT   Patient Name:    Shane Cabrera Date of Exam: 02/09/2024 Medical Rec #:  [id number]        Height:       61.0 in Accession #:    [id number]       Weight:       184.7 lb Date of Birth:  Feb 09, 1951        BSA:          1.826 m Patient Age:    73 years         BP:           139/78 mmHg Patient Gender: M                HR:           88 bpm. Exam Location:  Inpatient Procedure: 2D Echo, Cardiac Doppler, Color Doppler and Intracardiac            Opacification Agent (Both Spectral and Color Flow Doppler were            utilized during procedure). Indications:    NSTEMI  History:        Patient has prior history of Echocardiogram examinations, most                 recent 11/11/2022. CAD; Signs/Symptoms:Chest Pain.  Sonographer:    Philomena Daring Referring Phys: [id number] KARDIE TOBB IMPRESSIONS  1. Mild intracavitary gradient. Peak velocity 1.35 m/s. Peak gradient 7.3 mmHg. Left ventricular ejection fraction, by estimation, is 65 to 70%. The left ventricle has normal function. The left ventricle has no regional wall motion abnormalities. There is mild concentric left ventricular hypertrophy. Left ventricular diastolic parameters are consistent with Grade I diastolic dysfunction (impaired relaxation).  2. Right ventricular systolic function is normal. The right ventricular size is normal. There is normal pulmonary artery systolic pressure.  3. The mitral valve is normal in structure. No evidence of mitral valve regurgitation. No evidence of mitral stenosis.  4. The aortic valve is tricuspid. Aortic valve regurgitation is not visualized. No aortic stenosis is present.  5. The inferior vena cava is normal in size with greater than 50% respiratory variability, suggesting right atrial pressure of 3 mmHg. FINDINGS  Left Ventricle: Mild intracavitary gradient. Peak velocity 1.35 m/s. Peak gradient 7.3 mmHg. Left ventricular ejection fraction, by estimation, is 65 to 70%. The left ventricle has normal function. The left ventricle has no regional wall  motion abnormalities. Definity contrast agent was given IV to delineate the left ventricular endocardial borders. The left ventricular internal cavity size was normal in size. There is mild concentric left ventricular hypertrophy. Left ventricular diastolic parameters are consistent with Grade I diastolic dysfunction (impaired relaxation). Indeterminate filling pressures. Right Ventricle: The right ventricular size is normal. No increase in right ventricular wall thickness. Right ventricular systolic function is normal. There is normal pulmonary artery systolic pressure. The tricuspid regurgitant velocity is 1.67 m/s, and  with an assumed right atrial pressure of 3 mmHg, the estimated right ventricular systolic pressure is 14.2 mmHg. Left Atrium: Left atrial size was normal in size. Right Atrium: Right atrial size was normal in size.  Pericardium: There is no evidence of pericardial effusion. Mitral Valve: The mitral valve is normal in structure. No evidence of mitral valve regurgitation. No evidence of mitral valve stenosis. Tricuspid Valve: The tricuspid valve is normal in structure. Tricuspid valve regurgitation is mild . No evidence of tricuspid stenosis. Aortic Valve: The aortic valve is tricuspid. Aortic valve regurgitation is not visualized. No aortic stenosis is present. Pulmonic Valve: The pulmonic valve was normal in structure. Pulmonic valve regurgitation is not visualized. No evidence of pulmonic stenosis. Aorta: The aortic root is normal in size and structure. Venous: The inferior vena cava is normal in size with greater than 50% respiratory variability, suggesting right atrial pressure of 3 mmHg. IAS/Shunts: No atrial level shunt detected by color flow Doppler.  LEFT VENTRICLE PLAX 2D LVIDd:         3.90 cm   Diastology LVIDs:         2.60 cm   LV e' medial:    6.85 cm/s LV PW:         1.20 cm   LV E/e' medial:  11.8 LV IVS:        1.30 cm   LV e' lateral:   6.85 cm/s LVOT diam:     1.90 cm   LV E/e'  lateral: 11.8 LV SV:         60 LV SV Index:   33 LVOT Area:     2.84 cm LV IVRT:       106 msec  RIGHT VENTRICLE             IVC RV S prime:     14.30 cm/s  IVC diam: 1.30 cm TAPSE (M-mode): 1.7 cm LEFT ATRIUM             Index        RIGHT ATRIUM           Index LA diam:        3.00 cm 1.64 cm/m   RA Area:     12.90 cm LA Vol (A2C):   30.9 ml 16.92 ml/m  RA Volume:   27.40 ml  15.00 ml/m LA Vol (A4C):   21.9 ml 11.99 ml/m LA Biplane Vol: 25.9 ml 14.18 ml/m  AORTIC VALVE LVOT Vmax:   123.00 cm/s LVOT Vmean:  83.400 cm/s LVOT VTI:    0.210 m  AORTA Ao Root diam: 2.50 cm Ao Asc diam:  3.00 cm MITRAL VALVE                TRICUSPID VALVE MV Area (PHT): 3.77 cm     TR Peak grad:   11.2 mmHg MV Decel Time: 201 msec     TR Vmax:        167.00 cm/s MV E velocity: 80.50 cm/s MV A velocity: 131.00 cm/s  SHUNTS MV E/A ratio:  0.61         Systemic VTI:  0.21 m                             Systemic Diam: 1.90 cm Annabella Scarce MD Electronically signed by Annabella Scarce MD Signature Date/Time: 02/09/2024/12:13:46 PM    Final    DG Chest 2 View Result Date: 02/07/2024 EXAM: 2 VIEW(S) XRAY OF THE CHEST 02/07/2024 09:38:00 AM COMPARISON: PA and lateral radiographs of the chest dated 01/12/2017. CLINICAL HISTORY: cp FINDINGS: LUNGS AND PLEURA: No focal pulmonary opacity. No pulmonary edema. No pleural effusion.  No pneumothorax. HEART AND MEDIASTINUM: No acute abnormality of the cardiac and mediastinal silhouettes. BONES AND SOFT TISSUES: No acute osseous abnormality. IMPRESSION: 1. No acute process. Electronically signed by: Evalene Coho MD 02/07/2024 10:09 AM EST RP Workstation: [id number]    DISCHARGE EXAMINATION: Vitals:   02/10/24 0416 02/10/24 0742 02/10/24 0825 02/10/24 0855  BP: 114/62 (!) 153/68  (!) 153/68  Pulse: 60 62 85   Resp: 18 14 17    Temp: 98.2 F (36.8 C) 97.9 F (36.6 C)    TempSrc: Oral Oral    SpO2: 98% 97%    Weight:      Height:       General appearance: Awake alert.  In no  distress Resp: Clear to auscultation bilaterally.  Normal effort Cardio: S1-S2 is normal regular.  No S3-S4.  No rubs murmurs or bruit GI: Abdomen is soft.  Nontender nondistended.  Bowel sounds are present normal.  No masses organomegaly Extremities: No edema.  Full range of motion of lower extremities. Neurologic: Alert and oriented x3.  No focal neurological deficits.    DISPOSITION: Home  Discharge Instructions     Call MD for:  difficulty breathing, headache or visual disturbances   Complete by: As directed    Call MD for:  extreme fatigue   Complete by: As directed    Call MD for:  hives   Complete by: As directed    Call MD for:  persistant dizziness or light-headedness   Complete by: As directed    Call MD for:  persistant nausea and vomiting   Complete by: As directed    Call MD for:  severe uncontrolled pain   Complete by: As directed    Call MD for:  temperature >100.4   Complete by: As directed    Diet - low sodium heart healthy   Complete by: As directed    Discharge instructions   Complete by: As directed    Please cut back on the alcohol intake as we have discussed this morning.  Take your medications as prescribed.  Please be sure to follow-up with your PCP within 1 week after discharge.  You were cared for by a hospitalist during your hospital stay. If you have any questions about your discharge medications or the care you received while you were in the hospital after you are discharged, you can call the unit and asked to speak with the hospitalist on call if the hospitalist that took care of you is not available. Once you are discharged, your primary care physician will handle any further medical issues. Please note that NO REFILLS for any discharge medications will be authorized once you are discharged, as it is imperative that you return to your primary care physician (or establish a relationship with a primary care physician if you do not have one) for your  aftercare needs so that they can reassess your need for medications and monitor your lab values. If you do not have a primary care physician, you can call [phone] for a physician referral.   Increase activity slowly   Complete by: As directed          Allergies as of 02/10/2024       Reactions   Simvastatin Itching   Montelukast Rash        Medication List     STOP taking these medications    hydrochlorothiazide  25 MG tablet Commonly known as: HYDRODIURIL        TAKE these medications  albuterol 108 (90 Base) MCG/ACT inhaler Commonly known as: VENTOLIN HFA Inhale 2 puffs into the lungs as needed for wheezing or shortness of breath.   aspirin  EC 81 MG tablet Take 1 tablet (81 mg total) by mouth daily. Swallow whole.   budesonide-formoterol 80-4.5 MCG/ACT inhaler Commonly known as: SYMBICORT Inhale 2 puffs into the lungs as needed.   diltiazem 240 MG 24 hr capsule Commonly known as: DILACOR XR Take 480 mg by mouth daily.   fenofibrate 54 MG tablet Take 54 mg by mouth daily.   fluticasone 50 MCG/ACT nasal spray Commonly known as: FLONASE Place into both nostrils daily.   loratadine 10 MG tablet Commonly known as: CLARITIN Take 10 mg by mouth daily.   omeprazole 20 MG capsule Commonly known as: PRILOSEC Take 20 mg by mouth daily.   PARoxetine 10 MG tablet Commonly known as: PAXIL Take 10 mg by mouth daily.   rosuvastatin  40 MG tablet Commonly known as: CRESTOR  TAKE 1 TABLET BY MOUTH DAILY   sildenafil  50 MG tablet Commonly known as: VIAGRA  Take 1 tablet (50 mg total) by mouth daily as needed for erectile dysfunction.   spironolactone  25 MG tablet Commonly known as: ALDACTONE  TAKE 1 TABLET BY MOUTH DAILY   tadalafil 20 MG tablet Commonly known as: CIALIS Take 20 mg by mouth daily as needed for erectile dysfunction.   valsartan  320 MG tablet Commonly known as: DIOVAN  Take 1 tablet (320 mg total) by mouth daily.          Follow-up  Information     Dayna Motto, DO. Schedule an appointment as soon as possible for a visit in 1 week(s).   Specialty: Family Medicine Why: post hospitalization follow up Nov. 13,2025 @11 :30 AM Contact information: [address] [place] [address] [phone]                 TOTAL DISCHARGE TIME: 35 minutes  Adara Kittle Verdene  [facility] Pager on [email or web address]  02/11/2024, 10:58 AM

## 2024-02-10 NOTE — TOC Transition Note (Signed)
 Transition of Care [facility]) - Discharge Note   Patient Details  Name: Shane Cabrera MRN: [id number] Date of Birth: 1950-11-12  Transition of Care [facility]) CM/SW Contact:  Roxie KANDICE Stain, RN Phone Number: 02/10/2024, 11:02 AM   Clinical Narrative:    Shane Cabrera is stable to discharge home. Follow up apt on AVS. No ICM (Inpatient Care Management) needs at this time.    Final next level of care: Home/Self Care Barriers to Discharge: Barriers Resolved   Patient Goals and CMS Choice Patient states their goals for this hospitalization and ongoing recovery are:: return home          Discharge Placement                 home      Discharge Plan and Services Additional resources added to the After Visit Summary for                                       Social Drivers of Health (SDOH) Interventions SDOH Screenings   Food Insecurity: Food Insecurity Present (02/08/2024)  Housing: Low Risk  (02/08/2024)  Transportation Needs: Unknown (02/08/2024)  Utilities: Not At Risk (02/08/2024)  Alcohol Screen: Low Risk  (09/11/2021)  Depression (PHQ2-9): Low Risk  (09/11/2021)  Financial Resource Strain: Low Risk  (09/11/2021)  Physical Activity: Insufficiently Active (09/11/2021)  Social Connections: Socially Integrated (02/08/2024)  Stress: No Stress Concern Present (09/11/2021)  Tobacco Use: Medium Risk (02/07/2024)     Readmission Risk Interventions    02/10/2024   11:02 AM  Readmission Risk Prevention Plan  Post Dischage Appt Complete  Medication Screening Complete  Transportation Screening Complete

## 2024-02-10 NOTE — Progress Notes (Signed)
 Progress Note  Patient Name: Shane Cabrera Date of Encounter: 02/10/2024  Primary Cardiologist: Annabella Scarce, MD   Subjective   Patient seen and examined at his bedside. Aware of heart cath today.   Inpatient Medications    Scheduled Meds:  aspirin  EC  81 mg Oral Daily   diltiazem  480 mg Oral Daily   fluticasone furoate-vilanterol  1 puff Inhalation Daily   irbesartan  300 mg Oral Daily   PARoxetine  10 mg Oral Daily   rosuvastatin   40 mg Oral Daily   sodium chloride flush  3 mL Intravenous Q12H   Continuous Infusions:  sodium chloride     nitroGLYCERIN  Stopped (02/09/24 2235)   PRN Meds: sodium chloride, acetaminophen , albuterol, hydrALAZINE, LORazepam, melatonin, ondansetron  (ZOFRAN ) IV, sodium chloride flush   Vital Signs    Vitals:   02/09/24 1933 02/10/24 0011 02/10/24 0416 02/10/24 0742  BP: (!) 111/90 (!) 133/57 114/62 (!) 153/68  Pulse: 70 (!) 58 60 62  Resp: 20 18 18 14   Temp: (!) 97.5 F (36.4 C) 98 F (36.7 C) 98.2 F (36.8 C) 97.9 F (36.6 C)  TempSrc: Oral Oral Oral Oral  SpO2: 99% 98% 98% 97%  Weight:      Height:        Intake/Output Summary (Last 24 hours) at 02/10/2024 0826 Last data filed at 02/09/2024 2232 Gross per 24 hour  Intake 2977.16 ml  Output --  Net 2977.16 ml   Filed Weights   02/07/24 0927 02/08/24 1340  Weight: 83.5 kg 83.8 kg    Telemetry     Sinus rhythm - Personally Reviewed  ECG     - Personally Reviewed  Physical Exam    General: Comfortable, sitting up in bed Head: Atraumatic, normal size  Eyes: PEERLA, EOMI  Neck: Supple, normal JVD Cardiac: Normal S1, S2; RRR; no murmurs, rubs, or gallops Lungs: Clear to auscultation bilaterally Abd: Soft, nontender, no hepatomegaly  Ext: warm, no edema Musculoskeletal: No deformities, BUE and BLE strength normal and equal Skin: Warm and dry, no rashes   Neuro: Alert and oriented to person, place, time, and situation, CNII-XII grossly intact, no focal  deficits  Psych: Normal mood and affect   Labs    Chemistry Recent Labs  Lab 02/07/24 0952 02/08/24 1348 02/09/24 0823 02/10/24 0225  NA 138  --  135 136  K 4.0  --  3.9 3.9  CL 103  --  101 102  CO2 24  --  22 22  GLUCOSE 132*  --  139* 127*  BUN 17  --  15 19  CREATININE 1.29*  --  1.19 1.24  CALCIUM  10.5*  --  9.5 9.1  PROT  --  7.1  --   --   ALBUMIN  --  3.7  --   --   AST  --  35  --   --   ALT  --  29  --   --   ALKPHOS  --  50  --   --   BILITOT  --  1.0  --   --   GFRNONAA 58*  --  >60 >60  ANIONGAP 12  --  12 12     Hematology Recent Labs  Lab 02/07/24 0952 02/09/24 0226 02/10/24 0225  WBC 16.1* 11.1* 10.0  RBC 5.30 5.05 4.73  HGB 14.3 13.5 12.7*  HCT 43.6 42.2 39.9  MCV 82.3 83.6 84.4  MCH 27.0 26.7 26.8  MCHC 32.8 32.0 31.8  RDW 14.9 15.0 15.0  PLT 169 166 150    Cardiac EnzymesNo results for input(s): TROPONINI in the last 168 hours. No results for input(s): TROPIPOC in the last 168 hours.   BNPNo results for input(s): BNP, PROBNP in the last 168 hours.   DDimer  Recent Labs  Lab 02/08/24 1348  DDIMER <0.27     Radiology    CARDIAC CATHETERIZATION Result Date: 02/09/2024 1.  Mild irregularities with no evidence of obstructive coronary artery disease. 2.  Left ventricular angiography was not performed.  EF was normal by echo.  Mildly elevated left ventricular end-diastolic pressure. Recommendations: Elevated troponin is likely due to supply/demand ischemia in the setting of elevated blood pressure.  Recommend checking urine drug screen. Recommend blood pressure control and weaning off nitroglycerin  drip.   ECHOCARDIOGRAM COMPLETE Result Date: 02/09/2024    ECHOCARDIOGRAM REPORT   Patient Name:   Shane Cabrera Date of Exam: 02/09/2024 Medical Rec #:  [id number]        Height:       61.0 in Accession #:    [id number]       Weight:       184.7 lb Date of Birth:  07-05-1950        BSA:          1.826 m Patient Age:    73 years          BP:           139/78 mmHg Patient Gender: M                HR:           88 bpm. Exam Location:  Inpatient Procedure: 2D Echo, Cardiac Doppler, Color Doppler and Intracardiac            Opacification Agent (Both Spectral and Color Flow Doppler were            utilized during procedure). Indications:    NSTEMI  History:        Patient has prior history of Echocardiogram examinations, most                 recent 11/11/2022. CAD; Signs/Symptoms:Chest Pain.  Sonographer:    Philomena Daring Referring Phys: [id number] Mirka Barbone IMPRESSIONS  1. Mild intracavitary gradient. Peak velocity 1.35 m/s. Peak gradient 7.3 mmHg. Left ventricular ejection fraction, by estimation, is 65 to 70%. The left ventricle has normal function. The left ventricle has no regional wall motion abnormalities. There is mild concentric left ventricular hypertrophy. Left ventricular diastolic parameters are consistent with Grade I diastolic dysfunction (impaired relaxation).  2. Right ventricular systolic function is normal. The right ventricular size is normal. There is normal pulmonary artery systolic pressure.  3. The mitral valve is normal in structure. No evidence of mitral valve regurgitation. No evidence of mitral stenosis.  4. The aortic valve is tricuspid. Aortic valve regurgitation is not visualized. No aortic stenosis is present.  5. The inferior vena cava is normal in size with greater than 50% respiratory variability, suggesting right atrial pressure of 3 mmHg. FINDINGS  Left Ventricle: Mild intracavitary gradient. Peak velocity 1.35 m/s. Peak gradient 7.3 mmHg. Left ventricular ejection fraction, by estimation, is 65 to 70%. The left ventricle has normal function. The left ventricle has no regional wall motion abnormalities. Definity contrast agent was given IV to delineate the left ventricular endocardial borders. The left ventricular internal cavity size was normal in size. There is mild concentric left ventricular hypertrophy. Left  ventricular diastolic parameters are consistent with Grade I diastolic dysfunction (impaired relaxation). Indeterminate filling pressures. Right Ventricle: The right ventricular size is normal. No increase in right ventricular wall thickness. Right ventricular systolic function is normal. There is normal pulmonary artery systolic pressure. The tricuspid regurgitant velocity is 1.67 m/s, and  with an assumed right atrial pressure of 3 mmHg, the estimated right ventricular systolic pressure is 14.2 mmHg. Left Atrium: Left atrial size was normal in size. Right Atrium: Right atrial size was normal in size. Pericardium: There is no evidence of pericardial effusion. Mitral Valve: The mitral valve is normal in structure. No evidence of mitral valve regurgitation. No evidence of mitral valve stenosis. Tricuspid Valve: The tricuspid valve is normal in structure. Tricuspid valve regurgitation is mild . No evidence of tricuspid stenosis. Aortic Valve: The aortic valve is tricuspid. Aortic valve regurgitation is not visualized. No aortic stenosis is present. Pulmonic Valve: The pulmonic valve was normal in structure. Pulmonic valve regurgitation is not visualized. No evidence of pulmonic stenosis. Aorta: The aortic root is normal in size and structure. Venous: The inferior vena cava is normal in size with greater than 50% respiratory variability, suggesting right atrial pressure of 3 mmHg. IAS/Shunts: No atrial level shunt detected by color flow Doppler.  LEFT VENTRICLE PLAX 2D LVIDd:         3.90 cm   Diastology LVIDs:         2.60 cm   LV e' medial:    6.85 cm/s LV PW:         1.20 cm   LV E/e' medial:  11.8 LV IVS:        1.30 cm   LV e' lateral:   6.85 cm/s LVOT diam:     1.90 cm   LV E/e' lateral: 11.8 LV SV:         60 LV SV Index:   33 LVOT Area:     2.84 cm LV IVRT:       106 msec  RIGHT VENTRICLE             IVC RV S prime:     14.30 cm/s  IVC diam: 1.30 cm TAPSE (M-mode): 1.7 cm LEFT ATRIUM             Index         RIGHT ATRIUM           Index LA diam:        3.00 cm 1.64 cm/m   RA Area:     12.90 cm LA Vol (A2C):   30.9 ml 16.92 ml/m  RA Volume:   27.40 ml  15.00 ml/m LA Vol (A4C):   21.9 ml 11.99 ml/m LA Biplane Vol: 25.9 ml 14.18 ml/m  AORTIC VALVE LVOT Vmax:   123.00 cm/s LVOT Vmean:  83.400 cm/s LVOT VTI:    0.210 m  AORTA Ao Root diam: 2.50 cm Ao Asc diam:  3.00 cm MITRAL VALVE                TRICUSPID VALVE MV Area (PHT): 3.77 cm     TR Peak grad:   11.2 mmHg MV Decel Time: 201 msec     TR Vmax:        167.00 cm/s MV E velocity: 80.50 cm/s MV A velocity: 131.00 cm/s  SHUNTS MV E/A ratio:  0.61         Systemic VTI:  0.21 m  Systemic Diam: 1.90 cm Annabella Scarce MD Electronically signed by Annabella Scarce MD Signature Date/Time: 02/09/2024/12:13:46 PM    Final     Cardiac Studies     Patient Profile     73 y.o. male past medical hx of Hypertension, HLN presents with chest pain and elevated trop  Assessment & Plan     NSTEMI Hypertensive urgency - improved.   He is status post LHC yesterday, no indication for PCI only    Continue Aspirin  and crestor .   Blood pressure improving - added his home aldactone .   From a CV perspective he can be discharge   For questions or updates, please contact [facility] Please consult [email or web address] for contact info under Cardiology/STEMI.      Signed, Homero Hyson, DO  02/10/2024, 8:26 AM

## 2024-02-17 DIAGNOSIS — D72829 Elevated white blood cell count, unspecified: Secondary | ICD-10-CM | POA: Diagnosis not present

## 2024-02-17 DIAGNOSIS — N179 Acute kidney failure, unspecified: Secondary | ICD-10-CM | POA: Diagnosis not present

## 2024-02-17 DIAGNOSIS — Z09 Encounter for follow-up examination after completed treatment for conditions other than malignant neoplasm: Secondary | ICD-10-CM | POA: Diagnosis not present

## 2024-02-24 ENCOUNTER — Ambulatory Visit (INDEPENDENT_AMBULATORY_CARE_PROVIDER_SITE_OTHER): Admitting: Family

## 2024-02-24 ENCOUNTER — Encounter (HOSPITAL_BASED_OUTPATIENT_CLINIC_OR_DEPARTMENT_OTHER): Payer: Self-pay | Admitting: Family

## 2024-02-24 VITALS — BP 148/78 | HR 75 | Ht 61.0 in | Wt 189.3 lb

## 2024-02-24 DIAGNOSIS — E785 Hyperlipidemia, unspecified: Secondary | ICD-10-CM

## 2024-02-24 DIAGNOSIS — I10 Essential (primary) hypertension: Secondary | ICD-10-CM

## 2024-02-24 DIAGNOSIS — I2510 Atherosclerotic heart disease of native coronary artery without angina pectoris: Secondary | ICD-10-CM | POA: Diagnosis not present

## 2024-02-24 NOTE — Progress Notes (Signed)
 [facility] Assessment:    Date:  02/24/2024   ID:  Shane Cabrera, DOB 10/26/1950, MRN [id number]  PCP:  Dayna Motto, DO  Cardiologist:  Annabella Scarce, MD  Nephrologist:  Referring MD: No ref. provider found   CC: Hypertension  History of Present Illness:    Shane Cabrera is a 73 y.o. male with a hx of white coat hypertension superimposed hypertension, depression anxiety, hyperlipidemia, allergic rhinitis, ED, prediabetes, GERD, BPH, hepatic fibrosis here to follow up in [facility].   Established with [facility] 09/11/21 with hypertension >10 years. Lisinopril-hydrochlorothiazide  changed to valsartan  and hydrochlorothiazide . He was enrolled in [employer] RPM study. Cardiac CTA due to chest pain 10/2021 with calcium  score 1, minimal nonobstructive disease. Rosuvastatin  increased to 40mg  daily and aspirin  81mg  daily initiated.   Echo 11/2022 normal LVEF 50-55%, gr1dd, normal RV, mild MR.   Spironolactone  later added for BP control.   Admitted 11/3-11/6/25 with chest pain. Echo LVEF 65-70%, mild intracavitary gradient, no RWMA, mild LVH, RV normal, no significant valvular abnormalities. LHC with mild irregularities but no evidence of obstructive coronary disease. Elevated troponin felt to be supply/demand ischemia due to elevated BP.   Presents today for follow up with wife. He has no complaints today. Denies chest pain, shortness of breath, palpitations, lightheadedness, dizziness, edema. He exercises with a stationary bike and has goals of weight loss. He asked if his heart was OK to increase exercise; heart cath results were reviewed. He eats mostly at home, cooking stews and baking meals, monitoring sodium intake. He intermittently checks BP, approximately every other day and different times, readings vary from 130s-140s systolic. No BP log to review. He inquired about aspirin  prescription, as there are no refills. Aware he can  obtain OTC.  12/16/23 creatinine 1.13, GFR 68 02/17/24 - lab work done at PCP, unavailable at time of appointment   Previous antihypertensives: Lisinopril HCTZ  Past Medical History:  Diagnosis Date   CAD in native artery 03/05/2022   Hyperlipidemia    Hypertension    Murmur 09/03/2022   Prediabetes    White coat syndrome with diagnosis of hypertension 01/26/2022    Past Surgical History:  Procedure Laterality Date   COLONOSCOPY  2016   LEFT HEART CATH AND CORONARY ANGIOGRAPHY N/A 02/09/2024   Procedure: LEFT HEART CATH AND CORONARY ANGIOGRAPHY;  Surgeon: Darron Deatrice LABOR, MD;  Location: [facility];  Service: Cardiovascular;  Laterality: N/A;   NASAL SEPTOPLASTY W/ TURBINOPLASTY Bilateral 07/09/2020   Procedure: NASAL SEPTOPLASTY WITH BILATERAL  TURBINATE REDUCTION;  Surgeon: Karis Clunes, MD;  Location: [facility];  Service: ENT;  Laterality: Bilateral;    Current Medications: Current Meds  Medication Sig   albuterol  (VENTOLIN  HFA) 108 (90 Base) MCG/ACT inhaler Inhale 2 puffs into the lungs as needed for wheezing or shortness of breath.   aspirin  EC 81 MG tablet Take 1 tablet (81 mg total) by mouth daily. Swallow whole.   budesonide-formoterol (SYMBICORT) 80-4.5 MCG/ACT inhaler Inhale 2 puffs into the lungs as needed.   diltiazem  (DILACOR XR ) 240 MG 24 hr capsule Take 480 mg by mouth daily.   fluticasone  (FLONASE) 50 MCG/ACT nasal spray Place into both nostrils daily.   omeprazole (PRILOSEC) 20 MG capsule Take 20 mg by mouth daily.   PARoxetine  (PAXIL ) 10 MG tablet Take 10 mg by mouth daily.   rosuvastatin  (CRESTOR ) 40 MG tablet TAKE 1 TABLET BY MOUTH DAILY   spironolactone  (ALDACTONE ) 25 MG tablet TAKE 1 TABLET  BY MOUTH DAILY   tadalafil (CIALIS) 20 MG tablet Take 20 mg by mouth daily as needed for erectile dysfunction.   valsartan  (DIOVAN ) 320 MG tablet Take 1 tablet (320 mg total) by mouth daily.     Allergies:   Simvastatin and Montelukast   Social  History   Socioeconomic History   Marital status: Unknown    Spouse name: Not on file   Number of children: Not on file   Years of education: Not on file   Highest education level: Not on file  Occupational History   Not on file  Tobacco Use   Smoking status: Former   Smokeless tobacco: Never  Substance and Sexual Activity   Alcohol use: Yes    Comment: 3-4x per week   Drug use: Not Currently   Sexual activity: Not on file  Other Topics Concern   Not on file  Social History Narrative   Not on file   Social Drivers of Health   Financial Resource Strain: Low Risk  (09/11/2021)   Overall Financial Resource Strain (CARDIA)    Difficulty of Paying Living Expenses: Not very hard  Food Insecurity: Food Insecurity Present (02/08/2024)   Hunger Vital Sign    Worried About Running Out of Food in the Last Year: Sometimes true    Ran Out of Food in the Last Year: Sometimes true  Transportation Needs: Unknown (02/08/2024)   PRAPARE - Administrator, Civil Service (Medical): No    Lack of Transportation (Non-Medical): Not on file  Physical Activity: Insufficiently Active (09/11/2021)   Exercise Vital Sign    Days of Exercise per Week: 1 day    Minutes of Exercise per Session: 10 min  Stress: No Stress Concern Present (09/11/2021)   [employer] of Occupational Health - Occupational Stress Questionnaire    Feeling of Stress : Only a little  Social Connections: Socially Integrated (02/08/2024)   Social Connection and Isolation Panel    Frequency of Communication with Friends and Family: Twice a week    Frequency of Social Gatherings with Friends and Family: Once a week    Attends Religious Services: More than 4 times per year    Active Member of [employer] or Organizations: Yes    Attends Engineer, Structural: More than 4 times per year    Marital Status: Married     Family History: The patient's family history is notable for heart attack in mother, sister, brother.  His mother also had HTN.   ROS:   Please see the history of present illness.     All other systems reviewed and are negative.  EKGs/Labs/Other Studies Reviewed:    EKG Interpretation Date/Time:  Thursday February 24 2024 15:04:21 EST Ventricular Rate:  75 PR Interval:  176 QRS Duration:  90 QT Interval:  376 QTC Calculation: 419 R Axis:   37  Text Interpretation: Normal sinus rhythm TWI lead III, aVF Confirmed by Vannie Mora ([id number]) on 02/24/2024 3:06:51 PM    Recent Labs:  02/08/2024: ALT 29; TSH 1.377 02/10/2024: BUN 19; Creatinine, Ser 1.24; Hemoglobin 12.7; Platelets 150; Potassium 3.9; Sodium 136  Recent Lipid Panel 07/2021 total cholesterol 183, HDL 79, triglycerides 54, LDL 94     Component Value Date/Time   CHOL 141 02/09/2024 0226   CHOL 130 12/24/2021 0821   TRIG 148 02/09/2024 0226   HDL 53 02/09/2024 0226   HDL 68 12/24/2021 0821   CHOLHDL 2.7 02/09/2024 0226   VLDL 30 02/09/2024 0226  LDLCALC 58 02/09/2024 0226   LDLCALC 47 12/24/2021 0821    Physical Exam:   VS:  BP (!) 148/78   Pulse 75   Ht 5' 1 (1.549 m)   Wt 85.9 kg   SpO2 95%   BMI 35.77 kg/m  , BMI Body mass index is 35.77 kg/m. GENERAL:  Well appearing, well nourished, in no acute distress HEENT: Pupils equal round and reactive, fundi not visualized, oral mucosa unremarkable NECK:  No jugular venous distention, waveform within normal limits, carotid upstroke brisk and symmetric, no bruits, no thyromegaly LYMPHATICS:  No cervical adenopathy LUNGS:  Clear to auscultation bilaterally HEART:  RRR.  PMI not displaced or sustained, S1 and S2 within normal limits, no S3, no S4, no clicks, no rubs, murmurs; radial and PT pulses 2+ bilaterally  ABD:  Flat, positive bowel sounds normal in frequency in pitch, no bruits, no rebound, no guarding, no midline pulsatile mass, no hepatomegaly, no splenomegaly EXT:  2 plus pulses throughout, no edema, no cyanosis no clubbing SKIN:  No rashes no nodules;  right radial site with intact scab, no erythema, edema, tenderness NEURO:  Cranial nerves II through XII grossly intact, motor grossly intact throughout PSYCH:  Cognitively intact, oriented to person place and time   ASSESSMENT/PLAN:    HTN - uncontrolled/not at goal by in office BP today 158/76 aortic insufficiency  148/72; intermittently monitored at home; on valsartan  320mg  daily, diltiazem  480mg  daily, spironolactone  25mg  daily; 02/10/2024: creatinine 1.24, K+ 3.9; reassessed at PCP office 02/17/24 - will request results Request BMET from PCP Asked him to check BP at home once daily x1 week, will send MyChart message for follow up If BP consistently >130/80, consider increasing spironolactone  to 50mg  daily v. reinstating hydrochlorothiazide  25mg  daily with repeat BMET to follow  Nonobstructive CAD by [facility] 02/09/24 - stable without anginal symptoms today; GDMT: aspirin  81mg , rosuvastatin  40mg  Continue therapy as above Encourage aiming for 150 minutes of moderate intensity exercise weekly Encourage heart-healthy, low sodium diet   HLD, LDL goal <70 - 02/09/24: TC 141, Trigs 148, LDL 58, HDL 53 on rosuvastatin  40mg  daily. Will continue therapy with lipids at goal.   Screening for Secondary Hypertension:     09/11/2021   12:58 PM  Causes  Sleep Apnea Screened     - Comments Snores but wakes well rested. Consider sleep study at follow up.  Thyroid  Disease Screened     - Comments TSH ordered  Coarctation of the Aorta Screened     - Comments BP symmetrical  Compliance Screened    Relevant Labs/Studies:    Latest Ref Rng & Units 02/10/2024    2:25 AM 02/09/2024    8:23 AM 02/07/2024    9:52 AM  Basic Labs  Sodium 135 - 145 mmol/L 136  135  138   Potassium 3.5 - 5.1 mmol/L 3.9  3.9  4.0   Creatinine 0.61 - 1.24 mg/dL 8.75  8.80  8.70        Latest Ref Rng & Units 02/08/2024    1:48 PM 09/18/2021    8:08 AM  Thyroid    TSH 0.350 - 4.500 uIU/mL 1.377  1.510                      Disposition:    Follow up in 2 months with [facility]   Medication Adjustments/Labs and Tests Ordered: Current medicines are reviewed at length with the patient today.  Concerns regarding medicines are outlined above.  Signed, Reche Finder, NP 02/24/2024 4:16 PM    [facility]

## 2024-02-24 NOTE — Patient Instructions (Addendum)
 Medication Instructions:   Continue your current medications.    Testing/Procedures: Your EKG today was stable which is good!   Follow-Up: Please follow up in 2 months in [facility] with Dr. Raford, Reche Finder, NP or Allean Mink PharmD    Special Instructions:   Your cardiac catheterization showed no significant plaque build up which is reassuring. Continue Rosuvastatin  (Crestor ) to prevent any plaque build up from progressing.  We removed fenofibrate from your list as you are not taking and do not need this medication.   Our goal is for your blood pressure to be less than 130/80. Please check your blood pressure once per day and report back readings in one week.  Tips to Measure your Blood Pressure Correctly  Here's what you can do to ensure a correct reading:  Don't drink a caffeinated beverage or smoke during the 30 minutes before the test.  Sit quietly for five minutes before the test begins.  During the measurement, sit in a chair with your feet on the floor and your arm supported so your elbow is at about heart level.  The inflatable part of the cuff should completely cover at least 80% of your upper arm, and the cuff should be placed on bare skin, not over a shirt.  Don't talk during the measurement.  Have your blood pressure measured twice, with a brief break in between. If the readings are different by 5 points or more, have it done a third time.  Blood pressure categories  Blood pressure category SYSTOLIC (upper number)  DIASTOLIC (lower number)  Normal Less than 120 mm Hg and Less than 80 mm Hg  Elevated 120-129 mm Hg and Less than 80 mm Hg  High blood pressure: Stage 1 hypertension 130-139 mm Hg or 80-89 mm Hg  High blood pressure: Stage 2 hypertension 140 mm Hg or higher or 90 mm Hg or higher  Hypertensive crisis (consult your doctor immediately) Higher than 180 mm Hg and/or Higher than 120 mm Hg  Source: American Heart Association and American Stroke  Association. For more on getting your blood pressure under control, buy Controlling Your Blood Pressure, a Special Health Report from [facility].   Blood Pressure Log   Date   Time  Blood Pressure  Position  Example: Nov 1 9 AM 124/78 sitting

## 2024-02-29 ENCOUNTER — Encounter (HOSPITAL_BASED_OUTPATIENT_CLINIC_OR_DEPARTMENT_OTHER): Payer: Self-pay

## 2024-03-02 ENCOUNTER — Other Ambulatory Visit (HOSPITAL_BASED_OUTPATIENT_CLINIC_OR_DEPARTMENT_OTHER): Payer: Self-pay | Admitting: Cardiovascular Disease

## 2024-03-06 ENCOUNTER — Encounter (HOSPITAL_BASED_OUTPATIENT_CLINIC_OR_DEPARTMENT_OTHER): Admitting: Cardiovascular Disease

## 2024-03-07 ENCOUNTER — Other Ambulatory Visit: Payer: Self-pay | Admitting: Cardiovascular Disease

## 2024-03-13 ENCOUNTER — Other Ambulatory Visit: Payer: Self-pay

## 2024-03-14 DIAGNOSIS — H52223 Regular astigmatism, bilateral: Secondary | ICD-10-CM | POA: Diagnosis not present

## 2024-03-14 DIAGNOSIS — H524 Presbyopia: Secondary | ICD-10-CM | POA: Diagnosis not present

## 2024-03-15 ENCOUNTER — Other Ambulatory Visit: Payer: Self-pay | Admitting: Cardiovascular Disease

## 2024-03-16 MED ORDER — ROSUVASTATIN CALCIUM 40 MG PO TABS: 40.0000 mg | ORAL_TABLET | Freq: Every day | ORAL | 1 refills | Status: DC

## 2024-03-17 MED ORDER — ROSUVASTATIN CALCIUM 40 MG PO TABS: 40.0000 mg | ORAL_TABLET | Freq: Every day | ORAL | 3 refills | Status: AC

## 2024-04-27 ENCOUNTER — Encounter (HOSPITAL_BASED_OUTPATIENT_CLINIC_OR_DEPARTMENT_OTHER): Payer: Self-pay | Admitting: Family

## 2024-04-27 ENCOUNTER — Ambulatory Visit (INDEPENDENT_AMBULATORY_CARE_PROVIDER_SITE_OTHER): Admitting: Family

## 2024-04-27 ENCOUNTER — Encounter (HOSPITAL_BASED_OUTPATIENT_CLINIC_OR_DEPARTMENT_OTHER): Payer: Self-pay

## 2024-04-27 VITALS — BP 140/78 | HR 95 | Ht 61.0 in | Wt 189.0 lb

## 2024-04-27 DIAGNOSIS — E785 Hyperlipidemia, unspecified: Secondary | ICD-10-CM

## 2024-04-27 DIAGNOSIS — I2510 Atherosclerotic heart disease of native coronary artery without angina pectoris: Secondary | ICD-10-CM

## 2024-04-27 DIAGNOSIS — I10 Essential (primary) hypertension: Secondary | ICD-10-CM | POA: Diagnosis not present

## 2024-04-27 MED ORDER — DILTIAZEM HCL ER 240 MG PO CP24: 480.0000 mg | ORAL_CAPSULE | Freq: Every day | ORAL | Status: AC

## 2024-04-27 MED ORDER — SPIRONOLACTONE 50 MG PO TABS: 50.0000 mg | ORAL_TABLET | Freq: Every day | ORAL | 1 refills | Status: AC

## 2024-04-27 NOTE — Patient Instructions (Addendum)
 Medication Instructions:   INCREASE Spironolactone  to 50mg  daily You can take two of your 25mg  tablets together if you like  Be sure to check your pill bottles at home for Rosuvastatin  as it may be time to pick these up at the pharmacy.   *If you need a refill on your cardiac medications before your next appointment, please call your pharmacy*  Follow-Up: At [facility], you and your health needs are our priority.  As part of our continuing mission to provide you with exceptional heart care, our providers are all part of one team.  This team includes your primary Cardiologist (physician) and Advanced Practice Providers or APPs (Physician Assistants and Nurse Practitioners) who all work together to provide you with the care you need, when you need it.  Your next appointment:   4 month(s) with [facility]   We recommend signing up for the patient portal called MyChart.  Sign up information is provided on this After Visit Summary.  MyChart is used to connect with patients for Virtual Visits (Telemedicine).  Patients are able to view lab/test results, encounter notes, upcoming appointments, etc.  Non-urgent messages can be sent to your provider as well.   To learn more about what you can do with MyChart, go to [email or web address].   Other Instructions  Tips to Measure your Blood Pressure Correctly  Here's what you can do to ensure a correct reading:  Don't drink a caffeinated beverage or smoke during the 30 minutes before the test.  Sit quietly for five minutes before the test begins.  During the measurement, sit in a chair with your feet on the floor and your arm supported so your elbow is at about heart level.  The inflatable part of the cuff should completely cover at least 80% of your upper arm, and the cuff should be placed on bare skin, not over a shirt.  Don't talk during the measurement.  Blood pressure categories  Blood pressure category  SYSTOLIC (upper number)  DIASTOLIC (lower number)  Normal Less than 120 mm Hg and Less than 80 mm Hg  Elevated 120-129 mm Hg and Less than 80 mm Hg  High blood pressure: Stage 1 hypertension 130-139 mm Hg or 80-89 mm Hg  High blood pressure: Stage 2 hypertension 140 mm Hg or higher or 90 mm Hg or higher  Hypertensive crisis (consult your doctor immediately) Higher than 180 mm Hg and/or Higher than 120 mm Hg  Source: American Heart Association and American Stroke Association. For more on getting your blood pressure under control, buy Controlling Your Blood Pressure, a Special Health Report from [facility].   Blood Pressure Log   Date   Time  Blood Pressure  Position  Example: Nov 1 9 AM 124/78 sitting

## 2024-04-27 NOTE — Progress Notes (Signed)
 "  [facility] Assessment:    Date:  04/27/2024   ID:  Shane Cabrera, DOB 03-05-1951, MRN [id number]  PCP:  Dayna Motto, DO  Cardiologist:  Annabella Scarce, MD  Nephrologist:  Referring MD: Dayna Motto, DO   CC: Hypertension  History of Present Illness:    Shane Cabrera is a 74 y.o. male with a hx of white coat hypertension superimposed hypertension, depression anxiety, hyperlipidemia, allergic rhinitis, ED, prediabetes, GERD, BPH, hepatic fibrosis here to follow up in [facility].   Established with [facility] 09/11/21 with hypertension >10 years. Lisinopril-hydrochlorothiazide  changed to valsartan  and hydrochlorothiazide . He was enrolled in [employer] RPM study. Cardiac CTA due to chest pain 10/2021 with calcium  score 1, minimal nonobstructive disease. Rosuvastatin  increased to 40mg  daily and aspirin  81mg  daily initiated.   Echo 11/2022 normal LVEF 50-55%, gr1dd, normal RV, mild MR.   Spironolactone  later added for BP control.   Admitted 11/3-11/6/25 with chest pain. Echo LVEF 65-70%, mild intracavitary gradient, no RWMA, mild LVH, RV normal, no significant valvular abnormalities. LHC with mild irregularities but no evidence of obstructive coronary disease. Elevated troponin felt to be supply/demand ischemia due to elevated BP.   Last seen 02/24/24 with BP not at goal in clinic.  Reported home readings were better than clinic readings.  He was adhering to low-sodium diet and exercising on his stationary bike.  He was asked to monitor BP at home daily for 1 week and send back readings however he did not respond to MyChart message.  Presents today for follow up. BP in the office today is 140/78 with reported home readings consistent with office reading. However, he has not checked his BP at home in over a month. Notes not exercising as regularly as previous.  Reports no shortness of breath nor dyspnea on exertion. Reports no chest  pain, pressure, or tightness. No edema, orthopnea, PND. Reports no palpitations.    Previous antihypertensives: Lisinopril HCTZ  Past Medical History:  Diagnosis Date   CAD in native artery 03/05/2022   Hyperlipidemia    Hypertension    Murmur 09/03/2022   Prediabetes    White coat syndrome with diagnosis of hypertension 01/26/2022    Past Surgical History:  Procedure Laterality Date   COLONOSCOPY  2016   LEFT HEART CATH AND CORONARY ANGIOGRAPHY N/A 02/09/2024   Procedure: LEFT HEART CATH AND CORONARY ANGIOGRAPHY;  Surgeon: Darron Deatrice LABOR, MD;  Location: [facility];  Service: Cardiovascular;  Laterality: N/A;   NASAL SEPTOPLASTY W/ TURBINOPLASTY Bilateral 07/09/2020   Procedure: NASAL SEPTOPLASTY WITH BILATERAL  TURBINATE REDUCTION;  Surgeon: Karis Clunes, MD;  Location: [facility];  Service: ENT;  Laterality: Bilateral;    Current Medications: Current Meds  Medication Sig   albuterol  (VENTOLIN  HFA) 108 (90 Base) MCG/ACT inhaler Inhale 2 puffs into the lungs as needed for wheezing or shortness of breath.   aspirin  EC 81 MG tablet Take 1 tablet (81 mg total) by mouth daily. Swallow whole.   budesonide-formoterol (SYMBICORT) 80-4.5 MCG/ACT inhaler Inhale 2 puffs into the lungs as needed.   fenofibrate 54 MG tablet Take 54 mg by mouth daily.   fluticasone  (FLONASE) 50 MCG/ACT nasal spray Place into both nostrils daily. (Patient taking differently: Place 1 spray into both nostrils daily as needed for allergies or rhinitis.)   loratadine (CLARITIN) 10 MG tablet Take 10 mg by mouth daily. (Patient taking differently: Take 10 mg by mouth daily as needed for allergies.)   omeprazole (PRILOSEC)  20 MG capsule Take 20 mg by mouth daily.   PARoxetine  (PAXIL ) 10 MG tablet Take 10 mg by mouth daily.   rosuvastatin  (CRESTOR ) 40 MG tablet Take 1 tablet (40 mg total) by mouth daily.   tadalafil (CIALIS) 20 MG tablet Take 20 mg by mouth daily as needed for erectile dysfunction.    valsartan  (DIOVAN ) 320 MG tablet Take 1 tablet (320 mg total) by mouth daily.   [DISCONTINUED] diltiazem  (DILACOR XR ) 240 MG 24 hr capsule Take 480 mg by mouth daily.   [DISCONTINUED] spironolactone  (ALDACTONE ) 25 MG tablet TAKE 1 TABLET BY MOUTH DAILY     Allergies:   Simvastatin, Montelukast, Montelukast sodium, and Pravastatin   Social History   Socioeconomic History   Marital status: Unknown    Spouse name: Not on file   Number of children: Not on file   Years of education: Not on file   Highest education level: Not on file  Occupational History   Not on file  Tobacco Use   Smoking status: Former    Passive exposure: Past   Smokeless tobacco: Never  Substance and Sexual Activity   Alcohol use: Yes    Comment: 3-4x per week   Drug use: Not Currently   Sexual activity: Not on file  Other Topics Concern   Not on file  Social History Narrative   Not on file   Social Drivers of Health   Tobacco Use: Medium Risk (04/27/2024)   Patient History    Smoking Tobacco Use: Former    Smokeless Tobacco Use: Never    Passive Exposure: Past  Physicist, Medical Strain: Low Risk (09/11/2021)   Overall Financial Resource Strain (CARDIA)    Difficulty of Paying Living Expenses: Not very hard  Food Insecurity: Food Insecurity Present (02/08/2024)   Epic    Worried About Programme Researcher, Broadcasting/film/video in the Last Year: Sometimes true    Ran Out of Food in the Last Year: Sometimes true  Transportation Needs: Unknown (02/08/2024)   Epic    Lack of Transportation (Medical): No    Lack of Transportation (Non-Medical): Not on file  Physical Activity: Insufficiently Active (09/11/2021)   Exercise Vital Sign    Days of Exercise per Week: 1 day    Minutes of Exercise per Session: 10 min  Stress: No Stress Concern Present (09/11/2021)   [employer] of Occupational Health - Occupational Stress Questionnaire    Feeling of Stress : Only a little  Social Connections: Socially Integrated (02/08/2024)    Social Connection and Isolation Panel    Frequency of Communication with Friends and Family: Twice a week    Frequency of Social Gatherings with Friends and Family: Once a week    Attends Religious Services: More than 4 times per year    Active Member of Clubs or Organizations: Yes    Attends Banker Meetings: More than 4 times per year    Marital Status: Married  Depression (PHQ2-9): Low Risk (09/11/2021)   Depression (PHQ2-9)    PHQ-2 Score: 0  Alcohol Screen: Low Risk (09/11/2021)   Alcohol Screen    Last Alcohol Screening Score (AUDIT): 4  Housing: Low Risk (02/08/2024)   Epic    Unable to Pay for Housing in the Last Year: No    Number of Times Moved in the Last Year: 0    Homeless in the Last Year: No  Utilities: Not At Risk (02/08/2024)   Epic    Threatened with loss of utilities: No  Health Literacy: Not on file     Family History: The patient's family history is notable for heart attack in mother, sister, brother. His mother also had HTN.   ROS:   Please see the history of present illness.     All other systems reviewed and are negative.  EKGs/Labs/Other Studies Reviewed:         Recent Labs:  02/08/2024: ALT 29; TSH 1.377 02/10/2024: BUN 19; Creatinine, Ser 1.24; Hemoglobin 12.7; Platelets 150; Potassium 3.9; Sodium 136  Recent Lipid Panel 07/2021 total cholesterol 183, HDL 79, triglycerides 54, LDL 94     Component Value Date/Time   CHOL 141 02/09/2024 0226   CHOL 130 12/24/2021 0821   TRIG 148 02/09/2024 0226   HDL 53 02/09/2024 0226   HDL 68 12/24/2021 0821   CHOLHDL 2.7 02/09/2024 0226   VLDL 30 02/09/2024 0226   LDLCALC 58 02/09/2024 0226   LDLCALC 47 12/24/2021 0821    Physical Exam:   VS:  BP (!) 140/78 (BP Location: Right Arm, Patient Position: Sitting, Cuff Size: Normal)   Pulse 95   Ht 5' 1 (1.549 m)   Wt 189 lb (85.7 kg)   SpO2 97%   BMI 35.71 kg/m  , BMI Body mass index is 35.71 kg/m. GENERAL:  Well appearing, well nourished,  in no acute distress HEENT: Pupils equal round and reactive, fundi not visualized, oral mucosa unremarkable NECK:  No jugular venous distention, waveform within normal limits, carotid upstroke brisk and symmetric, no bruits, no thyromegaly LYMPHATICS:  No cervical adenopathy LUNGS:  Clear to auscultation bilaterally HEART:  RRR.  PMI not displaced or sustained, S1 and S2 within normal limits, no S3, no S4, no clicks, no rubs, murmurs; radial and PT pulses 2+ bilaterally  ABD:  Flat, positive bowel sounds normal in frequency in pitch, no bruits, no rebound, no guarding, no midline pulsatile mass, no hepatomegaly, no splenomegaly EXT:  2 plus pulses throughout, no edema, no cyanosis no clubbing SKIN:  No rashes no nodules; right radial site with intact scab, no erythema, edema, tenderness NEURO:  Cranial nerves II through XII grossly intact, motor grossly intact throughout PSYCH:  Cognitively intact, oriented to person place and time   ASSESSMENT/PLAN:    HTN - BP in the office today is 140/78 with reported home readings consistent with office reading. However, he has not checked his BP at home in over a month. He is currently on Valsartan  320mg  daily and diltiazem  420mg  daily, continue same.  Will increase spironolactone  to 50 mg today and repeat BMET in 7-10 days.  Discussed to monitor BP at home at least 2 hours after medications and sitting for 5-10 minutes.   Nonobstructive CAD by [facility] 02/09/24 - He has denied any recent anginal symptoms. Current GDMT:  aspirin  81 mg daily and rosuvastatin  40 mg daily. Continue exercise and heart healthy diet with reduced salt intake.   HLD, LDL goal <70 - Stable with most recent labs 02/09/2024 showing LDL of 58, total 141 and trigs 148. On rosuvastatin  40.   Screening for Secondary Hypertension:     09/11/2021   12:58 PM  Causes  Sleep Apnea Screened     - Comments Snores but wakes well rested. Consider sleep study at follow up.  Thyroid  Disease  Screened     - Comments TSH ordered  Coarctation of the Aorta Screened     - Comments BP symmetrical  Compliance Screened    Relevant Labs/Studies:    Latest Ref  Rng & Units 02/10/2024    2:25 AM 02/09/2024    8:23 AM 02/07/2024    9:52 AM  Basic Labs  Sodium 135 - 145 mmol/L 136  135  138   Potassium 3.5 - 5.1 mmol/L 3.9  3.9  4.0   Creatinine 0.61 - 1.24 mg/dL 8.75  8.80  8.70        Latest Ref Rng & Units 02/08/2024    1:48 PM 09/18/2021    8:08 AM  Thyroid    TSH 0.350 - 4.500 uIU/mL 1.377  1.510                  Disposition:    Follow up in 4 months with [facility]  Medication Adjustments/Labs and Tests Ordered: Current medicines are reviewed at length with the patient today.  Concerns regarding medicines are outlined above.    Signed, Reche Finder, NP 04/27/2024 4:47 PM    [facility] "

## 2024-05-09 NOTE — Progress Notes (Signed)
 Shane Cabrera                                          MRN: [id number]   05/09/2024   The [facility] Quality Team Specialist reviewed this patient medical record for the purposes of chart review for care gap closure. The following were reviewed: chart review for care gap closure-controlling blood pressure.    [facility] Quality Team

## 2024-05-11 LAB — BASIC METABOLIC PANEL WITH GFR
BUN/Creatinine Ratio: 16 (ref 10–24)
BUN: 19 mg/dL (ref 8–27)
CO2: 21 mmol/L (ref 20–29)
Calcium: 10.1 mg/dL (ref 8.6–10.2)
Chloride: 108 mmol/L — ABNORMAL HIGH (ref 96–106)
Creatinine, Ser: 1.19 mg/dL (ref 0.76–1.27)
Glucose: 109 mg/dL — ABNORMAL HIGH (ref 70–99)
Potassium: 4.6 mmol/L (ref 3.5–5.2)
Sodium: 141 mmol/L (ref 134–144)
eGFR: 64 mL/min/{1.73_m2}

## 2024-08-31 ENCOUNTER — Encounter (HOSPITAL_BASED_OUTPATIENT_CLINIC_OR_DEPARTMENT_OTHER): Admitting: Family
